# Patient Record
Sex: Female | Born: 1990 | Hispanic: Yes | Smoking: Never smoker
Health system: Southern US, Community
[De-identification: ages and names within clinical notes are randomized; demographics above are authoritative.]

## PROBLEM LIST (undated history)

## (undated) DIAGNOSIS — Z789 Other specified health status: Secondary | ICD-10-CM

## (undated) DIAGNOSIS — D649 Anemia, unspecified: Secondary | ICD-10-CM

## (undated) HISTORY — DX: Anemia, unspecified: D64.9

## (undated) HISTORY — PX: NO PAST SURGERIES: SHX2092

---

## 2018-12-15 ENCOUNTER — Encounter (HOSPITAL_COMMUNITY): Payer: Self-pay | Admitting: Family Medicine

## 2018-12-15 ENCOUNTER — Inpatient Hospital Stay (HOSPITAL_COMMUNITY): Payer: Self-pay

## 2018-12-15 ENCOUNTER — Inpatient Hospital Stay (HOSPITAL_COMMUNITY)
Admission: AD | Admit: 2018-12-15 | Discharge: 2018-12-16 | Disposition: A | Discharge status: Home / Self Care | Payer: Self-pay | Attending: Family Medicine | Admitting: Family Medicine

## 2018-12-15 ENCOUNTER — Other Ambulatory Visit: Payer: Self-pay

## 2018-12-15 DIAGNOSIS — O3680X0 Pregnancy with inconclusive fetal viability, not applicable or unspecified: Secondary | ICD-10-CM

## 2018-12-15 DIAGNOSIS — R109 Unspecified abdominal pain: Secondary | ICD-10-CM | POA: Insufficient documentation

## 2018-12-15 DIAGNOSIS — Z3A01 Less than 8 weeks gestation of pregnancy: Secondary | ICD-10-CM

## 2018-12-15 DIAGNOSIS — R030 Elevated blood-pressure reading, without diagnosis of hypertension: Secondary | ICD-10-CM

## 2018-12-15 DIAGNOSIS — O99891 Other specified diseases and conditions complicating pregnancy: Secondary | ICD-10-CM | POA: Insufficient documentation

## 2018-12-15 DIAGNOSIS — O26891 Other specified pregnancy related conditions, first trimester: Secondary | ICD-10-CM

## 2018-12-15 DIAGNOSIS — O26899 Other specified pregnancy related conditions, unspecified trimester: Secondary | ICD-10-CM

## 2018-12-15 HISTORY — DX: Other specified health status: Z78.9

## 2018-12-15 LAB — POCT PREGNANCY, URINE: Preg Test, Ur: POSITIVE — AB

## 2018-12-15 LAB — HIV ANTIBODY (ROUTINE TESTING W REFLEX): HIV Screen 4th Generation wRfx: NONREACTIVE

## 2018-12-15 LAB — URINALYSIS, ROUTINE W REFLEX MICROSCOPIC
Bacteria, UA: NONE SEEN
Bilirubin Urine: NEGATIVE
Glucose, UA: NEGATIVE mg/dL
Ketones, ur: NEGATIVE mg/dL
Leukocytes,Ua: NEGATIVE
Nitrite: NEGATIVE
Protein, ur: NEGATIVE mg/dL
Specific Gravity, Urine: 1.014 (ref 1.005–1.030)
pH: 7 (ref 5.0–8.0)

## 2018-12-15 LAB — COMPREHENSIVE METABOLIC PANEL
ALT: 20 U/L (ref 0–44)
AST: 23 U/L (ref 15–41)
Albumin: 4.3 g/dL (ref 3.5–5.0)
Alkaline Phosphatase: 64 U/L (ref 38–126)
Anion gap: 10 (ref 5–15)
BUN: 7 mg/dL (ref 6–20)
CO2: 22 mmol/L (ref 22–32)
Calcium: 8.8 mg/dL — ABNORMAL LOW (ref 8.9–10.3)
Chloride: 107 mmol/L (ref 98–111)
Creatinine, Ser: 0.59 mg/dL (ref 0.44–1.00)
GFR calc Af Amer: >60 mL/min (ref >60)
GFR calc non Af Amer: >60 mL/min (ref >60)
Glucose, Bld: 103 mg/dL — ABNORMAL HIGH (ref 70–99)
Potassium: 4 mmol/L (ref 3.5–5.1)
Sodium: 139 mmol/L (ref 135–145)
Total Bilirubin: 0.8 mg/dL (ref 0.3–1.2)
Total Protein: 7.3 g/dL (ref 6.5–8.1)

## 2018-12-15 LAB — CBC WITH DIFFERENTIAL/PLATELET
Abs Immature Granulocytes: 0.04 10*3/uL (ref 0.00–0.07)
Basophils Absolute: 0.1 10*3/uL (ref 0.0–0.1)
Basophils Relative: 1 %
Eosinophils Absolute: 0.2 10*3/uL (ref 0.0–0.5)
Eosinophils Relative: 2 %
HCT: 36.9 % (ref 36.0–46.0)
Hemoglobin: 11.8 g/dL — ABNORMAL LOW (ref 12.0–15.0)
Immature Granulocytes: 0 %
Lymphocytes Relative: 33 %
Lymphs Abs: 3.5 10*3/uL (ref 0.7–4.0)
MCH: 27.5 pg (ref 26.0–34.0)
MCHC: 32 g/dL (ref 30.0–36.0)
MCV: 86 fL (ref 80.0–100.0)
Monocytes Absolute: 0.6 10*3/uL (ref 0.1–1.0)
Monocytes Relative: 6 %
Neutro Abs: 6.2 10*3/uL (ref 1.7–7.7)
Neutrophils Relative %: 58 %
Platelets: 478 10*3/uL — ABNORMAL HIGH (ref 150–400)
RBC: 4.29 MIL/uL (ref 3.87–5.11)
RDW: 13.2 % (ref 11.5–15.5)
WBC: 10.7 10*3/uL — ABNORMAL HIGH (ref 4.0–10.5)
nRBC: 0 % (ref 0.0–0.2)

## 2018-12-15 LAB — ABO/RH: ABO/RH(D): O POS

## 2018-12-15 LAB — HCG, QUANTITATIVE, PREGNANCY: hCG, Beta Chain, Quant, S: 227 m[IU]/mL — ABNORMAL HIGH (ref <5)

## 2018-12-15 NOTE — MAU Note (Signed)
Patient reports to MAU c/o lower abdominal pain that started Sunday night patient reports the pain is a 8/10 that comes and goes. No bleeding. Pt reports slight white discharge. LMP was November 5th 2020.

## 2018-12-15 NOTE — MAU Provider Note (Signed)
History     CSN: [id number]  Arrival date and time: 12/15/18 1933   First Provider Initiated Contact with Patient 12/15/18 2037      Chief Complaint  Patient presents with  . Abdominal Pain   HPI   Carolyn Gomez is a 28 y.o. female G2P1001 @ [redacted]w[redacted]d here in MAU with complaints of abdominal pain. The pain is located in her lower abdomen. The pain started Monday; about 1 week ago. The pain comes and goes. She currently rates her pain 4-5/10. She had a positive pregnancy test on Wednesday.  She has not taken anything for the pain.   Spanish interpretor used.   OB History    Gravida  2   Para  1   Term  1   Preterm      AB      Living  1     SAB      TAB      Ectopic      Multiple      Live Births  1           Past Medical History:  Diagnosis Date  . Medical history non-contributory     Past Surgical History:  Procedure Laterality Date  . NO PAST SURGERIES      History reviewed. No pertinent family history.  Social History   Tobacco Use  . Smoking status: Never Smoker  . Smokeless tobacco: Never Used  Substance Use Topics  . Alcohol use: Never  . Drug use: Never    Allergies: No Known Allergies  No medications prior to admission.    Results for orders placed or performed during the hospital encounter of 12/15/18 (from the past 48 hour(s))  Urinalysis, Routine w reflex microscopic     Status: Abnormal   Collection Time: 12/15/18  7:45 PM  Result Value Ref Range   Color, Urine STRAW (A) YELLOW   APPearance CLEAR CLEAR   Specific Gravity, Urine 1.014 1.005 - 1.030   pH 7.0 5.0 - 8.0   Glucose, UA NEGATIVE NEGATIVE mg/dL   Hgb urine dipstick SMALL (A) NEGATIVE   Bilirubin Urine NEGATIVE NEGATIVE   Ketones, ur NEGATIVE NEGATIVE mg/dL   Protein, ur NEGATIVE NEGATIVE mg/dL   Nitrite NEGATIVE NEGATIVE   Leukocytes,Ua NEGATIVE NEGATIVE   RBC / HPF 0-5 0 - 5 RBC/hpf   WBC, UA 0-5 0 - 5 WBC/hpf   Bacteria, UA NONE SEEN NONE SEEN    Squamous Epithelial / LPF 0-5 0 - 5   Mucus PRESENT     Comment: Performed at [facility], [place] [address], [place], [place] [address]  Pregnancy, urine POC     Status: Abnormal   Collection Time: 12/15/18  7:50 PM  Result Value Ref Range   Preg Test, Ur POSITIVE (A) NEGATIVE    Comment:        THE SENSITIVITY OF THIS METHODOLOGY IS >24 mIU/mL   CBC with Differential/Platelet     Status: Abnormal   Collection Time: 12/15/18  8:57 PM  Result Value Ref Range   WBC 10.7 (H) 4.0 - 10.5 K/uL   RBC 4.29 3.87 - 5.11 MIL/uL   Hemoglobin 11.8 (L) 12.0 - 15.0 g/dL   HCT 36.9 36.0 - 46.0 %   MCV 86.0 80.0 - 100.0 fL   MCH 27.5 26.0 - 34.0 pg   MCHC 32.0 30.0 - 36.0 g/dL   RDW 13.2 11.5 - 15.5 %   Platelets 478 (H) 150 -  400 K/uL   nRBC 0.0 0.0 - 0.2 %   Neutrophils Relative % 58 %   Neutro Abs 6.2 1.7 - 7.7 K/uL   Lymphocytes Relative 33 %   Lymphs Abs 3.5 0.7 - 4.0 K/uL   Monocytes Relative 6 %   Monocytes Absolute 0.6 0.1 - 1.0 K/uL   Eosinophils Relative 2 %   Eosinophils Absolute 0.2 0.0 - 0.5 K/uL   Basophils Relative 1 %   Basophils Absolute 0.1 0.0 - 0.1 K/uL   Immature Granulocytes 0 %   Abs Immature Granulocytes 0.04 0.00 - 0.07 K/uL    Comment: Performed at [facility], [address] [place], [place] [address]  Comprehensive metabolic panel     Status: Abnormal   Collection Time: 12/15/18  8:57 PM  Result Value Ref Range   Sodium 139 135 - 145 mmol/L   Potassium 4.0 3.5 - 5.1 mmol/L   Chloride 107 98 - 111 mmol/L   CO2 22 22 - 32 mmol/L   Glucose, Bld 103 (H) 70 - 99 mg/dL   BUN 7 6 - 20 mg/dL   Creatinine, Ser 1.910.59 0.44 - 1.00 mg/dL   Calcium 8.8 (L) 8.9 - 10.3 mg/dL   Total Protein 7.3 6.5 - 8.1 g/dL   Albumin 4.3 3.5 - 5.0 g/dL   AST 23 15 - 41 U/L   ALT 20 0 - 44 U/L   Alkaline Phosphatase 64 38 - 126 U/L   Total Bilirubin 0.8 0.3 - 1.2 mg/dL   GFR calc non Af Amer >60 >60 mL/min   GFR calc Af Amer >60 >60 mL/min   Anion gap 10 5 - 15     Comment: Performed at [facility], [address] [place], [place] [address]  ABO/Rh     Status: None   Collection Time: 12/15/18  8:57 PM  Result Value Ref Range   ABO/RH(D) O POS    No rh immune globuloin      NOT A RH IMMUNE GLOBULIN CANDIDATE, PT RH POSITIVE Performed at [facility], [address] [place], [place] [address]   hCG, quantitative, pregnancy     Status: Abnormal   Collection Time: 12/15/18  8:57 PM  Result Value Ref Range   hCG, Beta Chain, Quant, S 227 (H) <5 mIU/mL    Comment:          GEST. AGE      CONC.  (mIU/mL)   <=1 WEEK        5 - 50     2 WEEKS       50 - 500     3 WEEKS       100 - 10,000     4 WEEKS     1,000 - 30,000     5 WEEKS     3,500 - 115,000   6-8 WEEKS     12,000 - 270,000    12 WEEKS     15,000 - 220,000        FEMALE AND NON-PREGNANT FEMALE:     LESS THAN 5 mIU/mL Performed at [facility], [address] [place], [place] [address]   HIV Antibody (routine testing w rflx)     Status: None   Collection Time: 12/15/18  8:57 PM  Result Value Ref Range   HIV Screen 4th Generation wRfx NON REACTIVE NON REACTIVE    Comment: Performed at [facility], [address], [place], [place] [address]   US OB LESS THAN 14 WEEKS WITH OB TRANSVAGINAL  Result Date: 12/15/2018 CLINICAL DATA:  Positive pregnancy test and pelvic pain EXAM: OBSTETRIC <14 WK [place] AND TRANSVAGINAL OB US TECHNIQUE: Both transabdominal and transvaginal ultrasound examinations were performed for complete evaluation of the gestation as well as the maternal uterus, adnexal regions, and pelvic cul-de-sac. Transvaginal technique was performed to assess early pregnancy. COMPARISON:  None. FINDINGS: Intrauterine gestational sac: Absent Maternal uterus/adnexae: Minimal free fluid is noted. The ovaries are within normal limits. IMPRESSION: No intrauterine gestational sac is noted. It is uncertain if this represents an early pregnancy or changes of interval  miscarriage. Correlation with quantitative beta HCG levels is recommended. Ultrasound can be repeated as clinically indicated. Electronically Signed   By: Alcide Clever M.D.   On: 12/15/2018 22:52   Review of Systems  Constitutional: Negative for fever.  Gastrointestinal: Positive for abdominal pain.  Genitourinary: Positive for dysuria and urgency. Negative for vaginal bleeding and vaginal discharge.   Physical Exam   Blood pressure (!) 144/80, pulse 74, temperature (!) 79 F (26.1 C), resp. rate 18, last menstrual period 11/08/2018.   Patient Vitals for the past 24 hrs:  BP Temp Temp src Pulse Resp  12/16/18 0053 (!) 144/80 -- -- -- 18  12/16/18 0038 (!) 144/80 -- -- 74 --  12/15/18 2154 123/85 -- -- 79 --  12/15/18 2005 (!) 158/92 (!) 79 F (26.1 C) -- -- --  12/15/18 2002 -- 98.4 F (36.9 C) Oral 78 18    Physical Exam  Constitutional: She is oriented to person, place, and time. She appears well-developed and well-nourished. No distress.  HENT:  Head: Normocephalic.  GI: Soft. She exhibits no distension and no mass. There is no abdominal tenderness. There is no rebound and no guarding.  Genitourinary:    Genitourinary Comments: Bimanual exam: Cervix closed, No CMT Uterus non tender, enlarged  Adnexa non tender, no masses bilaterally GC/Chlam, wet prep done without speculum.  Chaperone present for exam.    Musculoskeletal:        General: Normal range of motion.  Neurological: She is alert and oriented to person, place, and time.  Skin: Skin is warm. She is not diaphoretic.  Psychiatric: Her behavior is normal.   MAU Course  Procedures  None  MDM  Wet prep & GC HIV, CBC, Hcg, ABO US OB transvaginal  O positive blood type [place] discussed in detail with patient and partner using spanish interpretor.   Assessment and Plan   A:  1. Pregnancy of unknown anatomic location   2. Abdominal pain in pregnancy   3. [redacted] weeks gestation of pregnancy   4. Elevated BP without  diagnosis of hypertension     P:  Discharge home with strict return precautions Ectopic precautions Go to the [facility] office on Tuesday 12/15 @ 0900 for stat blood work Return to MAU if symptoms worsen OK to use tylenol OTC as directed on the bottle Pelvic rest  Tamrah Victorino, Harolyn Rutherford, NP 12/16/2018 10:11 AM

## 2018-12-16 LAB — WET PREP, GENITAL
Sperm: NONE SEEN
Trich, Wet Prep: NONE SEEN
Yeast Wet Prep HPF POC: NONE SEEN

## 2018-12-16 MED ORDER — ACETAMINOPHEN 500 MG PO TABS: 1000.0000 mg | ORAL_TABLET | Freq: Once | ORAL | Status: DC

## 2018-12-16 NOTE — Discharge Instructions (Signed)
Dolor abdominal durante el embarazo Abdominal Pain During Pregnancy  El dolor de vientre (abdominal) es habitual durante el embarazo. Hay muchas causas posibles. Generalmente, no se trata de un problema grave. Otras veces puede ser un signo de que algo no anda bien. Siempre comunquese con su mdico si tiene dolor abdominal. Siga estas indicaciones en su casa:  No tenga relaciones sexuales ni se coloque nada dentro de la vagina hasta que el dolor haya desaparecido completamente.  Descanse todo lo que pueda [employer] dolor se le haya calmado.  Beba suficiente lquido como para mantener el pis (orina) de color amarillo plido.  Tome los medicamentos de venta libre y los recetados solamente como se lo haya indicado el mdico.  Consulting civil engineer a todas las visitas de control como se lo haya indicado el mdico. Esto es importante. Comunquese con un mdico si:  El dolor contina o empeora despus de Production assistant, radio.  Siente dolor en la parte inferior del abdomen que: ? Va y viene en intervalos regulares. ? Se extiende a la espalda. ? Se siente como clicos menstruales.  Siente dolor o ardor al Garment/textile technologist. Solicite ayuda de inmediato si:  Tiene fiebre o siente escalofros.  Tiene una hemorragia vaginal abundante.  Tiene una prdida de lquido por la vagina.  Elimina tejidos por la vagina.  Vomita durante ms de 24horas.  Tiene heces lquidas (diarrea) durante ms de 24horas.  El beb se mueve menos de lo habitual.  Se siente dbil o se desmaya.  Le falta el aire.  Tiene dolor muy intenso en la parte superior del abdomen. Resumen  El dolor de vientre (abdominal) es habitual durante el embarazo. Hay muchas causas posibles.  Si tiene Media planner abdomen durante el embarazo, avise al mdico de inmediato.  Concurra a todas las visitas de control como se lo haya indicado el mdico. Esto es importante. Esta informacin no tiene Marine scientist el consejo del mdico. Asegrese de  hacerle al mdico cualquier pregunta que tenga. Document Released: 09/01/2010 Document Revised: 06/14/2016 Document Reviewed: 06/14/2016 Elsevier Patient Education  2020 [employer].

## 2018-12-17 LAB — GC/CHLAMYDIA PROBE AMP (~~LOC~~) NOT AT ARMC
Chlamydia: NEGATIVE
Comment: NEGATIVE
Comment: NORMAL
Neisseria Gonorrhea: NEGATIVE

## 2018-12-18 ENCOUNTER — Ambulatory Visit: Payer: Self-pay

## 2018-12-18 ENCOUNTER — Other Ambulatory Visit: Payer: Self-pay

## 2018-12-18 ENCOUNTER — Ambulatory Visit (INDEPENDENT_AMBULATORY_CARE_PROVIDER_SITE_OTHER): Payer: Self-pay | Admitting: *Deleted

## 2018-12-18 DIAGNOSIS — O3680X0 Pregnancy with inconclusive fetal viability, not applicable or unspecified: Secondary | ICD-10-CM

## 2018-12-18 LAB — BETA HCG QUANT (REF LAB): hCG Quant: 646 m[IU]/mL

## 2018-12-18 NOTE — Progress Notes (Signed)
Interpreter Carolyn Gomez present for encounter.  Pt reports having mild abdominal cramping and denies presence of vaginal bleeding. She was advised that she will be called later today with lab test result and plan of care. Pt voiced understanding.   1700  Pt was called and informed of appropriate rise in pregnancy hormone. She will need followup US to check for viability. This has been scheduled on 12/28 @ 3 pm.  Pt voiced understanding and agrees to plan of care.

## 2018-12-19 NOTE — Progress Notes (Signed)
Patient seen and assessed by nursing staff.  Agree with documentation and plan.  

## 2018-12-31 ENCOUNTER — Ambulatory Visit (HOSPITAL_COMMUNITY)
Admission: RE | Admit: 2018-12-31 | Discharge: 2018-12-31 | Disposition: A | Discharge status: Home / Self Care | Payer: Self-pay | Source: Ambulatory Visit | Attending: Family Medicine | Admitting: Family Medicine

## 2018-12-31 ENCOUNTER — Encounter: Payer: Self-pay | Admitting: Family Medicine

## 2018-12-31 ENCOUNTER — Ambulatory Visit (INDEPENDENT_AMBULATORY_CARE_PROVIDER_SITE_OTHER): Payer: Self-pay

## 2018-12-31 ENCOUNTER — Other Ambulatory Visit: Payer: Self-pay

## 2018-12-31 DIAGNOSIS — Z712 Person consulting for explanation of examination or test findings: Secondary | ICD-10-CM

## 2018-12-31 DIAGNOSIS — O3680X0 Pregnancy with inconclusive fetal viability, not applicable or unspecified: Secondary | ICD-10-CM | POA: Insufficient documentation

## 2018-12-31 NOTE — Progress Notes (Signed)
Pt here today following [place] for viability. Results reviewed with Kennon Rounds, MD who finds single living IUP and recommends pt begin prenatal care. Results and dating reviewed with pt; pt is 6w 2d today with a EDD of 08/24/19. Medications and allergies reviewed with pt; list of medications safe to take during pregnancy given. [place] office notified to provide proof of pregnancy letter and pt encouraged to begin prenatal care.   Interpreter Zoila with [employer] translated this encounter.   Apolonio Schneiders RN 12/31/18

## 2018-12-31 NOTE — Progress Notes (Signed)
Patient seen and assessed by nursing staff.  Agree with documentation and plan.  

## 2019-01-04 NOTE — L&D Delivery Note (Addendum)
OB/GYN Faculty Practice Delivery Note  Carolyn Gomez is a 29 y.o. G2P1001 s/p VD at [redacted]w[redacted]d. She was admitted for PPROM.   ROM: 11h 47m with clear/white fluid GBS Status: unknown, received PNC 2/2 preterm Maximum Maternal Temperature: 98.6  Labor Progress: This is a 29 yo G2P1001 at [redacted]w[redacted]d who presented as PPROM. During her course she received BMZ x1 and PCN for GBS unknown and preterm. Patient had epidural for pain management. She progressed to completed and after short few pushes delivered a viable female infant. She did receive TXA and methergine prophylactic due to known history of PPH with prior delivery.   Delivery Date/Time: 07/27/2019 @1530  Delivery: Called to room and patient was complete and pushing. Head delivered vertex ROA. Nuchal cord present x1 and reduced. Shoulder and body delivered in usual fashion. Infant with spontaneous cry, placed on mother's abdomen, dried and stimulated. Cord clamped after 1-minute delay, and cut by father under my direct supervision. Cord blood drawn. Placenta delivered spontaneously with gentle cord traction. Fundus firm with massage and Pitocin. Labia, perineum, vagina, and cervix were inspected, and presented with left labial laceration that was hemostatic without repair.   Placenta: spontaneous, intact, 3v cord delivered @ 1539 Complications: none  Lacerations: left, labial, hemostatic, no repair EBL: 75 cc Analgesia: Epidural  Postpartum Planning [x]  message to sent to schedule follow-up  [x]  vaccines UTD  Infant: Female  APGARs 9/9  pending weight check  , DO Family Medicine, PGY 3  GME ATTESTATION:  I saw and evaluated the patient. I agree with the findings and the plan of care as documented in the resident's note.  , MD OB Fellow, Faculty [facility], [facility] 07/27/2019 4:36 PM

## 2019-02-26 ENCOUNTER — Telehealth: Payer: Self-pay | Admitting: *Deleted

## 2019-02-26 ENCOUNTER — Other Ambulatory Visit: Payer: Self-pay | Admitting: Family Medicine

## 2019-02-26 DIAGNOSIS — Z3490 Encounter for supervision of normal pregnancy, unspecified, unspecified trimester: Secondary | ICD-10-CM

## 2019-02-26 NOTE — Telephone Encounter (Signed)
LVM using [facility] interpreter Domingo Cocking [phone].  Asking pt to call office to go over screening questions prior to visit.Natajah Derderian Zimmerman Rumple, CMA

## 2019-02-27 ENCOUNTER — Other Ambulatory Visit: Payer: Self-pay

## 2019-02-27 DIAGNOSIS — Z3490 Encounter for supervision of normal pregnancy, unspecified, unspecified trimester: Secondary | ICD-10-CM

## 2019-02-28 LAB — OBSTETRIC PANEL, INCLUDING HIV
Antibody Screen: NEGATIVE
Basophils Absolute: 0 10*3/uL (ref 0.0–0.2)
Basos: 1 %
EOS (ABSOLUTE): 0.2 10*3/uL (ref 0.0–0.4)
Eos: 2 %
HIV Screen 4th Generation wRfx: NONREACTIVE
Hematocrit: 33.9 % — ABNORMAL LOW (ref 34.0–46.6)
Hemoglobin: 11.3 g/dL (ref 11.1–15.9)
Hepatitis B Surface Ag: NEGATIVE
Immature Grans (Abs): 0.1 10*3/uL (ref 0.0–0.1)
Immature Granulocytes: 1 %
Lymphocytes Absolute: 1.9 10*3/uL (ref 0.7–3.1)
Lymphs: 22 %
MCH: 28.3 pg (ref 26.6–33.0)
MCHC: 33.3 g/dL (ref 31.5–35.7)
MCV: 85 fL (ref 79–97)
Monocytes Absolute: 0.5 10*3/uL (ref 0.1–0.9)
Monocytes: 6 %
Neutrophils Absolute: 5.9 10*3/uL (ref 1.4–7.0)
Neutrophils: 68 %
Platelets: 445 10*3/uL (ref 150–450)
RBC: 3.99 x10E6/uL (ref 3.77–5.28)
RDW: 13.7 % (ref 11.7–15.4)
RPR Ser Ql: NONREACTIVE
Rh Factor: POSITIVE
Rubella Antibodies, IGG: 12.2 index (ref >0.99)
WBC: 8.6 10*3/uL (ref 3.4–10.8)

## 2019-03-01 LAB — HGB FRAC. W/SOLUBILITY
Hgb A2 Quant: 2.3 % (ref 1.8–3.2)
Hgb A: 97.2 % (ref 96.4–98.8)
Hgb C: 0 %
Hgb F Quant: 0.5 % (ref 0.0–2.0)
Hgb S: 0 %
Hgb Solubility: NEGATIVE
Hgb Variant: 0 %

## 2019-03-02 LAB — CULTURE, OB URINE

## 2019-03-02 LAB — URINE CULTURE, OB REFLEX

## 2019-03-03 ENCOUNTER — Telehealth: Payer: Self-pay | Admitting: Family Medicine

## 2019-03-03 MED ORDER — CEPHALEXIN 500 MG PO CAPS: 500.0000 mg | ORAL_CAPSULE | Freq: Two times a day (BID) | ORAL | 0 refills | Status: AC

## 2019-03-03 NOTE — Telephone Encounter (Signed)
+   Lactobacillus.  Result discussed.  Treatment recommended.  F/U with Dr. Talbert Forest as planned.  She and her husband verbalized understanding.  TOC in 3 weeks.

## 2019-03-06 ENCOUNTER — Other Ambulatory Visit (HOSPITAL_COMMUNITY)
Admission: RE | Admit: 2019-03-06 | Discharge: 2019-03-06 | Disposition: A | Discharge status: Home / Self Care | Payer: Self-pay | Source: Ambulatory Visit | Attending: Family Medicine | Admitting: Family Medicine

## 2019-03-06 ENCOUNTER — Other Ambulatory Visit: Payer: Self-pay

## 2019-03-06 ENCOUNTER — Encounter: Payer: Self-pay | Admitting: Family Medicine

## 2019-03-06 ENCOUNTER — Ambulatory Visit (INDEPENDENT_AMBULATORY_CARE_PROVIDER_SITE_OTHER): Payer: Self-pay | Admitting: Family Medicine

## 2019-03-06 VITALS — BP 104/64 | HR 78 | Ht 61.0 in | Wt 183.2 lb

## 2019-03-06 DIAGNOSIS — Z3492 Encounter for supervision of normal pregnancy, unspecified, second trimester: Secondary | ICD-10-CM

## 2019-03-06 MED ORDER — PRENATAL VITAMIN 27-0.8 MG PO TABS: 1.0000 | ORAL_TABLET | Freq: Every day | ORAL | 3 refills | Status: DC

## 2019-03-06 NOTE — Patient Instructions (Addendum)
Thank you for coming to see me today. It was a pleasure! Today we talked about:   If you have any cramping/contractions, vaginal bleeding, or fluid leaking, go immediately to [facility] to be evaluated.  Please call the health department to have your flu shot schedule.  Please follow-up with our OB clinic in 4 weeks or sooner as needed.  If you have any questions or concerns, please do not hesitate to call the office at [phone].  Take Care,   Carolyn Kamryn Gauthier, DO  [place] trimestre de [place] Second Trimester of Pregnancy  El segundo trimestre va desde la semana14 hasta la 54 (desde el mes 4 hasta el 6). Este suele ser el momento en el que mejor se siente. En general, las nuseas matutinas han disminuido o han desaparecido completamente. Tendr ms energa y podr aumentarle el apetito. El beb en gestacin se desarrolla rpidamente. Hacia el final del sexto mes, el beb mide aproximadamente 9 pulgadas (23 cm) y pesa alrededor de 1 libras (700 g). Es probable que sienta al beb [employer] 53 y 83 semanas del [place]. Siga estas indicaciones en su casa: Medicamentos  [employer] de venta libre y los recetados solamente como se lo haya indicado el mdico. Algunos medicamentos son seguros para tomar durante el Media planner y otros no lo son.  Tome vitaminas prenatales que contengan por lo menos [id number] (?g) de cido flico.  Si tiene dificultad para mover el intestino (estreimiento), tome un medicamento para ablandar las heces (laxante) si su mdico se lo autoriza. Comida y bebida   Ingiera alimentos saludables de [place] regular.  No coma carne cruda ni quesos sin cocinar.  Si obtiene poca cantidad de calcio de los alimentos que ingiere, consulte a su mdico sobre la posibilidad de tomar un suplemento diario de calcio.  Evite el consumo de alimentos ricos en grasas y azcares, como los alimentos fritos y los dulces.  Si tiene Higher education careers adviser  (nuseas) o devuelve (vomita): ? Ingiera 4 o 5comidas pequeas por TEFL teacher de 3abundantes. ? Intente comer algunas galletitas saladas. ? Beba lquidos [employer], en lugar de [employer].  Para evitar el estreimiento: ? Consuma alimentos ricos en fibra, como frutas y verduras frescas, cereales integrales y frijoles. ? Beba suficiente lquido para mantener el pis (orina) claro o de color amarillo plido. Actividad  Haga ejercicios solamente como se lo haya indicado el mdico. Interrumpa la actividad fsica si comienza a tener calambres.  No haga ejercicio si hace demasiado calor, hay demasiada humedad o se encuentra en un lugar de mucha altura (altitud alta).  Evite levantar pesos [employer].  Use zapatos con tacones bajos. Mantenga una buena postura al sentarse y pararse.  Puede continuar teniendo [employer], a menos que el mdico le indique lo contrario. Alivio del dolor y del Tree surgeon  Use un sostn que le brinde buen soporte si sus mamas estn sensibles.  Dese baos de asiento con agua tibia para Best boy o las molestias causadas por las hemorroides. Use una crema para las hemorroides si el mdico la autoriza.  Descanse con las piernas elevadas si tiene calambres o dolor de cintura.  Si desarrolla venas hinchadas y abultadas (vrices) en las piernas: ? Use medias de compresin o medias de descanso como se lo haya indicado el mdico. ? Levante (eleve) los pies durante 57minutos, 3 o 4veces por Training and development officer. ? Limite el consumo de sal en sus alimentos. Cuidado prenatal  [place] sus preguntas. Llvelas  cuando concurra a las visitas prenatales.  Concurra a todas las visitas prenatales como se lo haya indicado el mdico. Esto es importante. Seguridad  [employer] cinturn de seguridad cuando conduzca.  Haga una lista de los nmeros de telfono de Associate Professor, que [employer] nmeros de telfono de familiares, amigos, [facility], as como los  departamentos de polica y bomberos. Instrucciones generales  Consulte a su mdico sobre los [employer] debe comer o pdale que la ayude a Clinical research associate a quien pueda aconsejarla si necesita ese servicio.  Consulte a su mdico acerca de dnde se dictan clases prenatales cerca de donde vive. Comience las clases antes del mes 6 de embarazo.  No se d baos de inmersin en agua caliente, baos turcos ni saunas.  No se haga duchas vaginales ni use tampones o toallas higinicas perfumadas.  No mantenga las piernas cruzadas durante [place].  Vaya al dentista si an no lo hizo. Use un cepillo de cerdas suaves para cepillarse los dientes. Psese el hilo dental suavemente.  No fume, no consuma hierbas ni beba alcohol. No tome frmacos que el mdico no haya autorizado.  No consuma ningn producto que contenga nicotina o tabaco, como cigarrillos y Administrator, Civil Service. Si necesita ayuda para dejar de fumar, consulte al mdico.  Evite el contacto con las bandejas sanitarias de los gatos y la tierra que estos animales usan. Estos elementos contienen bacterias que pueden causar defectos congnitos al beb y la posible prdida del beb (aborto espontneo) o la muerte fetal. Comunquese con un mdico si:  Tiene clicos leves o siente presin en la parte baja del vientre.  Tiene dolor al hacer pis (orinar).  Advierte un lquido con olor ftido que proviene de la vagina.  Tiene Programme researcher, broadcasting/film/video (nuseas), devuelve (vomita) o tiene deposiciones acuosas (diarrea).  Sufre un dolor persistente en el abdomen.  Siente mareos. Solicite ayuda de inmediato si:  Tiene fiebre.  Tiene una prdida de lquido por la vagina.  Tiene sangrado o pequeas prdidas vaginales.  Siente dolor intenso o clicos en el abdomen.  Sube o baja de peso rpidamente.  Tiene dificultades para recuperar el aliento y siente dolor en el pecho.  Sbitamente se le hinchan mucho el rostro, las [place], los tobillos,  los pies o las piernas.  No ha sentido los movimientos del beb durante Georgianne Fick.  Siente un dolor de cabeza intenso que no se alivia al tomar [employer].  Tiene dificultad para ver. Resumen  El segundo trimestre va desde la semana14 hasta la 27, desde el mes 4 hasta el 6. Este suele ser el momento en el que mejor se siente.  Para cuidarse y cuidar a su beb en gestacin, debe comer alimentos saludables, tomar medicamentos solamente si su mdico le indica que lo haga y hacer actividades que sean seguras para usted y su beb.  Llame al mdico si se enferma o si nota algo inusual acerca de su embarazo. Tambin llame al mdico si necesita ayuda para saber qu alimentos debe comer o si quiere saber qu actividades puede realizar de forma segura. Esta informacin no tiene Theme park manager el consejo del mdico. Asegrese de hacerle al mdico cualquier pregunta que tenga. Document Revised: 09/14/2016 Document Reviewed: 09/14/2016 Elsevier Patient Education  2020 [employer].

## 2019-03-06 NOTE — Progress Notes (Addendum)
Carolyn Gomez is a 29 y.o. yo G2P1001  at [redacted]w[redacted]d who presents for her initial prenatal visit. Pregnancy is not planned She reports Previously with nausea and vomiting during the first few months but now currently asymptomatic.  Does report some spotting last week that has completely resolved.. She  is not taking PNV. See flow sheet for details.  PMH, POBH, FH, meds, allergies and Social Hx reviewed.  Prenatal Exam: Gen: Well nourished, well developed.  No distress.  Vitals noted. HEENT: Normocephalic, atraumatic.  Neck supple without cervical lymphadenopathy, thyromegaly or thyroid nodules.  Fair dentition. CV: RRR no murmur, gallops or rubs Lungs: CTAB.  Normal respiratory effort without wheezes or rales. Abd: soft, NTND. +BS.   GU: Normal external female genitalia without lesions.  Normal vaginal, well rugated without lesions. No vaginal discharge.  Bimanual exam: No adnexal mass or TTP. No CMT.  Uterus size wnl. Ext: No clubbing, cyanosis or edema. Psych: Normal grooming and dress.  Not depressed or anxious appearing.  Normal thought content and process without flight of ideas or looseness of associations.  Assessment & Plan: 1) 29 y.o. yo G2P1001 at [redacted]w[redacted]d via early ultrasound on 12/28 (patient on OCPs when she conceived making her LMP not as reliable) doing well.  Patient reports last Pap smear 3 years ago, repeat a Pap smear with GC/chlamydia done today. Current pregnancy issues include: none. Dating is reliable. Prenatal labs reviewed, notable for lactobacillus growing in urine, patient currently with 2 days left of her Keflex.  She is to follow-up next week for quad screen as well as repeat OB culture. Genetic screening offered: Patient to have done 03/11/2019. Early glucola is not indicated.  PHQ-9 and Pregnancy Medical Home forms completed and reviewed.  Bleeding and pain precautions reviewed. Importance of prenatal vitamins reviewed.  Follow up in 4 weeks in OB  clinic. Patient to have flu shot at health department.  [place] Fe Okubo, DO PGY-3, [facility]

## 2019-03-07 LAB — CYTOLOGY - PAP
Adequacy: ABSENT
Chlamydia: NEGATIVE
Comment: NEGATIVE
Comment: NORMAL
Diagnosis: NEGATIVE
Neisseria Gonorrhea: NEGATIVE

## 2019-03-11 ENCOUNTER — Encounter: Payer: Self-pay | Admitting: Family Medicine

## 2019-03-11 ENCOUNTER — Other Ambulatory Visit: Payer: Self-pay

## 2019-03-15 ENCOUNTER — Encounter: Payer: Self-pay | Admitting: Family Medicine

## 2019-04-03 ENCOUNTER — Ambulatory Visit (INDEPENDENT_AMBULATORY_CARE_PROVIDER_SITE_OTHER): Payer: Self-pay | Admitting: Family Medicine

## 2019-04-03 ENCOUNTER — Other Ambulatory Visit: Payer: Self-pay

## 2019-04-03 VITALS — BP 102/60 | HR 89 | Wt 189.2 lb

## 2019-04-03 DIAGNOSIS — Z3492 Encounter for supervision of normal pregnancy, unspecified, second trimester: Secondary | ICD-10-CM

## 2019-04-03 DIAGNOSIS — Z3402 Encounter for supervision of normal first pregnancy, second trimester: Secondary | ICD-10-CM

## 2019-04-03 LAB — POCT 1 HR PRENATAL GLUCOSE: Glucose 1 Hr Prenatal, POC: 84 mg/dL

## 2019-04-03 NOTE — Progress Notes (Signed)
  Patient Name: Delton Prairie Date of Birth: March 17, 1990 [facility] Prenatal Visit  [place] Matthias Hughs is a 29 y.o. G2P1001 at [redacted]w[redacted]d here for routine follow up. She is dated by early ultrasound (6 weeks).  She reports no complaints.  She denies vaginal bleeding.  See flow sheet for details.  Vitals:   04/03/19 0845  BP: 102/60  Pulse: 89  Fetal heart tones 130 bpm.   A/P: Pregnancy at [redacted]w[redacted]d.  Doing well.   . Dating reviewed, dating tab is correct . Fetal heart tones Appropriate . Pregnancy issues include None  . Problem list updated: Yes.  . Influenza vaccine not administered as not influenza season.   . The patient has the following indication for screening preexisting diabetes: BMI > 25 and high risk ethnicity (Latino, [place] American, Native American, [place] Islander, Asian American) , 1 hour GTT was performed today. . Anatomy ultrasound ordered to be scheduled at 18-20 weeks, schedule through [employer] program. . Patient is interested in genetic screening. As she is past 13 weeks and 6 days, a Quad screen  was offered (previously offered at initial OB, however never followed up to have this completed, will collect today.  . Pregnancy education including expected weight gain in pregnancy, OTC medication use, continued use of prenatal vitamin, smoking cessation if applicable, and nutrition in pregnancy.   . Bleeding and pain precautions reviewed.  Asymptomatic bacteriuria: > 100,000 colonies of lactobacillus, treated with Keflex in early March.  Will repeat OB culture today to ensure resolution.   Follow up 4 weeks, or sooner if needed.  Provided with MAU/women and children's address.  Allayne Stack, DO

## 2019-04-03 NOTE — Patient Instructions (Signed)
Wonderful to see you, we have scheduled your anatomy scan.  We will also recheck your urine today and get your genetic screening.  In addition, we should go ahead and check your sugar, you can come back later this week or early next to have this checked.

## 2019-04-04 ENCOUNTER — Encounter: Payer: Self-pay | Admitting: Family Medicine

## 2019-04-04 DIAGNOSIS — Z3493 Encounter for supervision of normal pregnancy, unspecified, third trimester: Secondary | ICD-10-CM | POA: Insufficient documentation

## 2019-04-04 DIAGNOSIS — Z3402 Encounter for supervision of normal first pregnancy, second trimester: Secondary | ICD-10-CM | POA: Insufficient documentation

## 2019-04-05 LAB — URINE CULTURE, OB REFLEX

## 2019-04-05 LAB — CULTURE, OB URINE

## 2019-04-06 LAB — AFP TETRA
DIA Mom Value: 0.62
DIA Value (EIA): 94.46 pg/mL
DSR (By Age)    1 IN: 682
DSR (Second Trimester) 1 IN: 10000
Gestational Age: 19.6 WEEKS
MSAFP Mom: 0.97
MSAFP: 46.2 ng/mL
MSHCG Mom: 0.7
MSHCG: 14677 m[IU]/mL
Maternal Age At EDD: 30.2 yr
Osb Risk: 10000
T18 (By Age): 1:2657 {titer}
Test Results:: NEGATIVE
Weight: 189 [lb_av]
uE3 Mom: 0.93
uE3 Value: 1.79 ng/mL

## 2019-04-09 NOTE — Progress Notes (Signed)
Attempted to call pt no answer or VM set up. Will try again later. Nethaniel Mattie Bruna Potter, CMA

## 2019-04-10 ENCOUNTER — Telehealth: Payer: Self-pay | Admitting: *Deleted

## 2019-04-10 NOTE — Telephone Encounter (Signed)
-----   Message from Allayne Stack, DO sent at 04/09/2019  9:50 AM EDT ----- Please let patient know her genetic screening is negative, meaning no concern seen with this. Additionally her glucose screen was normal, will likely just recheck later in her pregnancy. Thank you! Dr Annia Friendly

## 2019-04-10 NOTE — Telephone Encounter (Signed)
Pt informed using spanish interpreter sedi [phone]. Kessa Fairbairn Bruna Potter, CMA

## 2019-04-22 ENCOUNTER — Encounter: Payer: Self-pay | Admitting: Family Medicine

## 2019-05-06 NOTE — Progress Notes (Signed)
  [facility] Prenatal Visit  Carolyn Gomez is a 29 y.o. G2P1001 at [redacted]w[redacted]d here for routine follow up. She is dated by early ultrasound at 6 weeks.  She reports Generally no complaints, occasionally has heartburn improved with drinking some milk.  She reports good fetal movement. No bleeding, loss of fluid, contractions. See flow sheet for details.  Quad screen negative.  Undecided on birth control options, considering post placental IUD if would not be too expensive for her.  Would like to breast-feed.  Vitals:   05/07/19 1336  BP: (!) 100/58  Pulse: 85  Fetal heart tones: 150 bpm Fundal height: 25 cm   A/P: Pregnancy at [redacted]w[redacted]d.  Doing well.   . Dating reviewed, dating tab is correct . Fetal heart tones Appropriate . Fundal height within expected range.  . Pregnancy issues include None . Marland Kitchen Anatomy ultrasound reviewed and notable for single live intrauterine pregnancy with normal amniotic fluid volume, no gross fetal abnormalities identified.  . Problem list updated Yes.  . Influenza vaccine not administered as not influenza season. .  . Indications for screening for preexisting diabetes include: Early 1 hour screening on 3/31 was normal, high risk due to pregravida BMI and ethnicity, will do additional screen on next visit.  . Pregnancy education provided on the following topics: fetal growth and movement, ultrasound assessment, and upcoming laboratory assessment.   . Scheduled for [facility] during second trimester. . Preterm labor precautions given.   Follow up 4 weeks with [facility] faculty clinic.  Should receive 28-week labs (CBC, RPR, HIV) and 1 hour Glucola at that time, patient informed.  Allayne Stack, DO

## 2019-05-07 ENCOUNTER — Other Ambulatory Visit: Payer: Self-pay

## 2019-05-07 ENCOUNTER — Encounter: Payer: Self-pay | Admitting: Family Medicine

## 2019-05-07 ENCOUNTER — Ambulatory Visit (INDEPENDENT_AMBULATORY_CARE_PROVIDER_SITE_OTHER): Payer: Self-pay | Admitting: Family Medicine

## 2019-05-07 VITALS — BP 100/58 | HR 85 | Wt 193.0 lb

## 2019-05-07 DIAGNOSIS — Z3402 Encounter for supervision of normal first pregnancy, second trimester: Secondary | ICD-10-CM

## 2019-05-07 NOTE — Patient Instructions (Signed)
It was wonderful seeing you today!  If you have any leakage of fluid, vaginal bleeding, severe abdominal pains, headache not improving with regular management, please make sure you get MAU.  Please follow-up in 4 weeks, expect labs and the glucose test at this time.

## 2019-06-13 ENCOUNTER — Ambulatory Visit (INDEPENDENT_AMBULATORY_CARE_PROVIDER_SITE_OTHER): Payer: Self-pay | Admitting: Family Medicine

## 2019-06-13 ENCOUNTER — Other Ambulatory Visit: Payer: Self-pay

## 2019-06-13 VITALS — BP 105/70 | HR 93 | Wt 197.0 lb

## 2019-06-13 DIAGNOSIS — O99210 Obesity complicating pregnancy, unspecified trimester: Secondary | ICD-10-CM | POA: Insufficient documentation

## 2019-06-13 DIAGNOSIS — Z9189 Other specified personal risk factors, not elsewhere classified: Secondary | ICD-10-CM

## 2019-06-13 DIAGNOSIS — Z3493 Encounter for supervision of normal pregnancy, unspecified, third trimester: Secondary | ICD-10-CM

## 2019-06-13 DIAGNOSIS — O99213 Obesity complicating pregnancy, third trimester: Secondary | ICD-10-CM

## 2019-06-13 DIAGNOSIS — Z3402 Encounter for supervision of normal first pregnancy, second trimester: Secondary | ICD-10-CM

## 2019-06-13 LAB — POCT 1 HR PRENATAL GLUCOSE: Glucose 1 Hr Prenatal, POC: 97 mg/dL

## 2019-06-13 MED ORDER — TETANUS-DIPHTH-ACELL PERTUSSIS 5-2.5-18.5 LF-MCG/0.5 IM SUSP: 0.5000 mL | Freq: Once | INTRAMUSCULAR | 0 refills | Status: AC

## 2019-06-13 MED ORDER — TETANUS-DIPHTH-ACELL PERTUSSIS 5-2.5-18.5 LF-MCG/0.5 IM SUSP: 0.5000 mL | Freq: Once | INTRAMUSCULAR | 0 refills | Status: DC

## 2019-06-13 NOTE — Patient Instructions (Signed)
It was a pleasure meeting you today.  You were seen for a prenatal visit  You will have some blood work today.  I would encourage you to consider the COVID vaccine   Follow up in 2 weeks with your PCP  Dana Allan, MD [facility] Medicine Residency  Segundo trimestre de embarazo Second Trimester of Pregnancy  El segundo trimestre va desde la semana14 hasta la 27 (desde el mes 4 hasta el 6). Este suele ser el momento en el que mejor se siente. En general, las nuseas matutinas han disminuido o han desaparecido completamente. Tendr ms energa y podr aumentarle el apetito. El beb en gestacin se desarrolla rpidamente. Hacia el final del sexto mes, el beb mide aproximadamente 9 pulgadas (23 cm) y pesa alrededor de 1 libras (700 g). Es probable que sienta al beb [employer] 18 y 20 semanas del [place]. Siga estas indicaciones en su casa: Medicamentos  [employer] de venta libre y los recetados solamente como se lo haya indicado el mdico. Algunos medicamentos son seguros para tomar durante el Psychiatrist y otros no lo son.  Tome vitaminas prenatales que contengan por lo menos (?g) de cido flico.  Si tiene dificultad para mover el intestino (estreimiento), tome un medicamento para ablandar las heces (laxante) si su mdico se lo autoriza. Comida y bebida   Ingiera alimentos saludables de [place] regular.  No coma carne cruda ni quesos sin cocinar.  Si obtiene poca cantidad de calcio de los alimentos que ingiere, consulte a su mdico sobre la posibilidad de tomar un suplemento diario de calcio.  Evite el consumo de alimentos ricos en grasas y azcares, como los alimentos fritos y los dulces.  Si tiene Programme researcher, broadcasting/film/video (nuseas) o devuelve (vomita): ? Ingiera 4 o 5comidas pequeas por Geophysical data processor de 3abundantes. ? Intente comer algunas galletitas saladas. ? Beba lquidos Altria Group, en lugar de [employer] Scientific.  Para evitar el  estreimiento: ? Consuma alimentos ricos en fibra, como frutas y verduras frescas, cereales integrales y frijoles. ? Beba suficiente lquido para mantener el pis (orina) claro o de color amarillo plido. Actividad  Haga ejercicios solamente como se lo haya indicado el mdico. Interrumpa la actividad fsica si comienza a tener calambres.  No haga ejercicio si hace demasiado calor, hay demasiada humedad o se encuentra en un lugar de mucha altura (altitud alta).  Evite levantar pesos [employer] Brands.  Use zapatos con tacones bajos. Mantenga una buena postura al sentarse y pararse.  Puede continuar teniendo [employer] [place] [employer], a menos que el mdico le indique lo contrario. Alivio del dolor y del Dentist  Use un sostn que le brinde buen soporte si sus mamas estn sensibles.  Dese baos de asiento con agua tibia para Engineer, materials o las molestias causadas por las hemorroides. Use una crema para las hemorroides si el mdico la autoriza.  Descanse con las piernas elevadas si tiene calambres o dolor de cintura.  Si desarrolla venas hinchadas y abultadas (vrices) en las piernas: ? Use medias de compresin o medias de descanso como se lo haya indicado el mdico. ? Levante (eleve) los pies durante , 3 o 4veces por Futures trader. ? Limite el consumo de sal en sus alimentos. Cuidado prenatal  Tonga sus preguntas. Llvelas cuando concurra a las visitas prenatales.  Concurra a todas las visitas prenatales como se lo haya indicado el mdico. Esto es importante. Seguridad  Mellon Financial cinturn de seguridad cuando conduzca.  Haga una lista de los nmeros  de telfono de emergencia, que BJ's nmeros de telfono de familiares, amigos, [employer] Resources, as como los departamentos de [place] y bomberos. Instrucciones generales  Consulte a su mdico sobre los Pepco Holdings debe comer o pdale que la ayude a Pension scheme manager a quien pueda aconsejarla si necesita ese servicio.  Consulte a su mdico  acerca de dnde se dictan clases prenatales cerca de donde vive. Comience las clases antes del mes 6 de embarazo.  No se d baos de inmersin en agua caliente, baos turcos ni saunas.  No se haga duchas vaginales ni use tampones o toallas higinicas perfumadas.  No mantenga las piernas cruzadas durante mucho tiempo.  Vaya al dentista si an no lo hizo. Use un cepillo de cerdas suaves para cepillarse los dientes. Psese el hilo dental suavemente.  No fume, no consuma hierbas ni beba alcohol. No tome frmacos que el mdico no haya autorizado.  No consuma ningn producto que contenga nicotina o tabaco, como cigarrillos y Psychologist, sport and exercise. Si necesita ayuda para dejar de fumar, consulte al mdico.  Evite el contacto con las bandejas sanitarias de los gatos y la tierra que estos animales usan. Estos elementos contienen bacterias que pueden causar defectos congnitos al beb y la posible prdida del beb (aborto espontneo) o la muerte fetal. Comunquese con un mdico si:  Tiene clicos leves o siente presin en la parte baja del vientre.  Tiene dolor al hacer pis (orinar).  Advierte un lquido con olor ftido que proviene de la vagina.  Tiene Higher education careers adviser (nuseas), devuelve (vomita) o tiene deposiciones acuosas (diarrea).  Sufre un dolor persistente en el abdomen.  Siente mareos. Solicite ayuda de inmediato si:  Tiene fiebre.  Tiene una prdida de lquido por la vagina.  Tiene sangrado o pequeas prdidas vaginales.  Siente dolor intenso o clicos en el abdomen.  Sube o baja de peso rpidamente.  Tiene dificultades para recuperar el aliento y siente dolor en el pecho.  Sbitamente se le hinchan mucho el rostro, las [place], los tobillos, los pies o las piernas.  No ha sentido los movimientos del beb durante Leone Brand.  Siente un dolor de cabeza intenso que no se alivia al tomar [employer]ne[employer].  Tiene dificultad para ver. Resumen  El segundo trimestre va desde  la semana14 hasta la 16, desde el mes 4 hasta el 6. Este suele ser el momento en el que mejor se siente.  Para cuidarse y cuidar a su beb en gestacin, debe comer alimentos saludables, tomar medicamentos solamente si su mdico le indica que lo haga y hacer actividades que sean seguras para usted y su beb.  Llame al mdico si se enferma o si nota algo inusual acerca de su embarazo. Tambin llame al mdico si necesita ayuda para saber qu alimentos debe comer o si quiere saber qu actividades puede realizar de forma segura. Esta informacin no tiene Marine scientist el consejo del mdico. Asegrese de hacerle al mdico cualquier pregunta que tenga. Document Revised: 09/14/2016 Document Reviewed: 09/14/2016 Elsevier Patient Education  2020 Reynolds [employer].

## 2019-06-13 NOTE — Progress Notes (Signed)
[name] is a 29 y.o. G2P1001 at [redacted]w[redacted]d here for routine follow up. She is dated by early ultrasound.  She reports no complaints. See flow sheet for details.  A/P: Pregnancy at [redacted]w[redacted]d.  Doing well.    . Dating reviewed, dating tab is correct . Fetal heart tones Appropriate . Fundal height within expected range.  . Pregnancy issues include None. . Problem list updated Yes.  . Infant feeding choice: Breastfeeding . Contraception choice: IUD . Infant circumcision desired not applicable  . The patient does not have a history of Cesarean delivery and no referral to Center for [facility] is indicated . Influenza vaccine not administered as not influenza season.   . Tdap was given today. . 1 hour glucola, CBC, RPR, and HIV were obtained today.   . Pregnancy medical home and PHQ-9 forms were done today and reviewed.   . Rh status was reviewed and patient does not need Rhogam.  Rhogam was not given today.   . Childbirth and education classes were offered. . Pregnancy education regarding benefits of breastfeeding, contraception, fetal growth, expected weight gain, and safe infant sleep were discussed.  Marland Kitchen Appropriate weight gain was discussed. . Preterm labor and fetal movement precautions reviewed. . Patient scheduled in [facility] during third trimester on June 28 at 1330.  Follow up 2 weeks.

## 2019-06-13 NOTE — Progress Notes (Addendum)
  [facility] Prenatal Visit  Carolyn Gomez is a 29 y.o. G2P1001 at [redacted]w[redacted]d here for routine follow up. She is dated by early ultrasound.  She reports no complaints. The patient speaks Spanish as their primary language.  An interpreter was used for the entire visit.    She reports she is eating very frequently. Wonders how much weight she should gain. She has early satiety but then develops immediate hunger after completing meal. Eats soups, tortillas, little protein. Partner Bradly Chris) is supportive.   She reports fetal movement. She denies vaginal bleeding, contractions, or loss of fluid. See flow sheet for details.   Lab review: Normal early 1 hour, negative quad screen, O+   Ultrasound review: having a girl, posterior placenta, dated by early ultrasound, normal anatomy at health department  Prior Pregnancy: G1 Pregnancy 13 years ago, uncomplicated, shoulder dystocia, or laceration per patient, very 'easy' and 'fast' delivery Patient does report severe anemia in G1 pregnancy. She reports severe anemia and had some 'but not excess' bleeding after last delivery. No family history of easy bleeding/bruising. No other episodes of severe anemia. Did require 1 unit pRBC after last delivery.    Vitals:   06/13/19 0912  BP: 105/70  Pulse: 93      A/P: Pregnancy at [redacted]w[redacted]d.  Doing well.   1. Routine prenatal care:  Marland Kitchen Dating reviewed, dating tab is correct . Fetal heart tones Appropriate . Fundal height within expected range.  . Infant feeding choice: Breastfeeding . Contraception choice: IUD will discuss scholarship with [facility]. [facility]  . Infant circumcision desired not applicable  . The patient does not have a history of Cesarean delivery and no referral to Center for [facility] is indicated . Influenza vaccine not administered as not influenza season.   . Tdap Rx given today. Ensure she has received at follow up.  . 1 hour glucola, CBC, RPR, Hep C, and HIV were obtained  today.    . Rh status O+  . Pregnancy medical home and PHQ-9 forms were done today and reviewed.   . Childbirth and education classes were offered. . Pregnancy education regarding benefits of breastfeeding, contraception, fetal growth, expected weight gain, and safe infant sleep were discussed.  . Preterm labor and fetal movement precautions reviewed.  2. Pregnancy issues include the following and were addressed as appropriate today:  Prior elevated BP in first trimester--resolved. Suspect due to pain and anxiety in MAU. All other BP readings have been normal and we will continue to monitor closely History of severe anemia in G1 and (presumed) postpartum hemorrhage requiring transfusion. Discussed. Amenable to blood products. CBC today.  Problem list and pregnancy box updated: Yes.  Weight gain in pregnancy discussed, she has gained 14 pounds, expected 11-15, discussed small snacks, reducing juices, and sodas. 1 hour glucola normal today.   Follow up 2 weeks.

## 2019-06-14 LAB — CBC
Hematocrit: 31.3 % — ABNORMAL LOW (ref 34.0–46.6)
Hemoglobin: 10 g/dL — ABNORMAL LOW (ref 11.1–15.9)
MCH: 26.3 pg — ABNORMAL LOW (ref 26.6–33.0)
MCHC: 31.9 g/dL (ref 31.5–35.7)
MCV: 82 fL (ref 79–97)
Platelets: 519 10*3/uL — ABNORMAL HIGH (ref 150–450)
RBC: 3.8 x10E6/uL (ref 3.77–5.28)
RDW: 12.8 % (ref 11.7–15.4)
WBC: 9.5 10*3/uL (ref 3.4–10.8)

## 2019-06-14 LAB — HCV AB W REFLEX TO QUANT PCR: HCV Ab: <0.1 s/co ratio (ref 0.0–0.9)

## 2019-06-14 LAB — RPR: RPR Ser Ql: NONREACTIVE

## 2019-06-14 LAB — HCV INTERPRETATION

## 2019-06-14 LAB — HIV ANTIBODY (ROUTINE TESTING W REFLEX): HIV Screen 4th Generation wRfx: NONREACTIVE

## 2019-06-18 ENCOUNTER — Encounter: Payer: Self-pay | Admitting: Family Medicine

## 2019-06-18 ENCOUNTER — Telehealth: Payer: Self-pay | Admitting: Family Medicine

## 2019-06-18 DIAGNOSIS — O99013 Anemia complicating pregnancy, third trimester: Secondary | ICD-10-CM

## 2019-06-18 DIAGNOSIS — D649 Anemia, unspecified: Secondary | ICD-10-CM | POA: Insufficient documentation

## 2019-06-18 DIAGNOSIS — Z9189 Other specified personal risk factors, not elsewhere classified: Secondary | ICD-10-CM | POA: Insufficient documentation

## 2019-06-18 MED ORDER — FERROUS SULFATE 324 (65 FE) MG PO TBEC: DELAYED_RELEASE_TABLET | ORAL | 3 refills | Status: DC

## 2019-06-18 NOTE — Telephone Encounter (Signed)
The patient speaks Spanish as their primary language.  An interpreter was used for the entire visit.   Attempted to call X2. Call got cut off on attempt 2.   Anemia affecting pregnancy Discussed, likely IDA. Started daily iron therapy. Discussed side effects. Patient had severe anemia in G1 pregnancy, see note. Required transfusion postpartum. No dystocia, laceration although delivery was 'very fast' per patient.   Repeat CBC and ferritin in 4 weeks.  Updated problem list.  Terisa Starr, MD  [facility] Medicine Teaching Service

## 2019-07-01 ENCOUNTER — Other Ambulatory Visit: Payer: Self-pay

## 2019-07-01 ENCOUNTER — Ambulatory Visit (INDEPENDENT_AMBULATORY_CARE_PROVIDER_SITE_OTHER): Payer: Self-pay | Admitting: Family Medicine

## 2019-07-01 ENCOUNTER — Encounter: Payer: Self-pay | Admitting: Family Medicine

## 2019-07-01 VITALS — BP 108/58 | HR 79 | Wt 202.4 lb

## 2019-07-01 DIAGNOSIS — Z3493 Encounter for supervision of normal pregnancy, unspecified, third trimester: Secondary | ICD-10-CM

## 2019-07-01 NOTE — Progress Notes (Signed)
°  [facility] Prenatal Visit  Carolyn Gomez is a 29 y.o. G2P1001 at [redacted]w[redacted]d here for routine follow up. She is dated by early ultrasound.  She reports abdominal pain.  She reports fetal movement. She denies vaginal bleeding, contractions, or loss of fluid.  See flow sheet for details.  She reports 2 episodes of sharp epigastric abdominal pain lasting a few minutes total since Saturday, 6/26.  She was able to drink a glass of milk or water and felt better shortly afterwards.  Not related to eating.  Reminds her of contractions with previous pregnancy, states when it was happening "made me feel like I want to push." Otherwise, states she is doing well.  Denies any lightheadedness/dizziness, headaches, vaginal bleeding, leakage of fluid, visual changes, lower extremity swelling, itching.  Eating and drinking as normal.  Vitals:   07/01/19 1340  BP: (!) 108/58  Pulse: 79   General: Alert, NAD HEENT: NCAT, MMM  Cardiac: RRR  Lungs: Clear bilaterally, no increased WOB  Abdomen: Gravid uterus, nontender to palpation of epigastrium  A/P: Pregnancy at [redacted]w[redacted]d.  Doing well.   1. Routine prenatal care:   Dating reviewed, dating tab is correct  Fetal heart tones: Appropriate  Fundal height: within expected range.   The patient does not have a history of HSV and valacyclovir is not at this time.   The patient does not have a history of Cesarean delivery and no referral to Center for [facility] is indicated  Infant feeding choice: Breastfeeding  Contraception choice: IUD, will get set up with scholarship program  Infant circumcision desired not applicable  Influenza vaccine not administered as not influenza season.    Still needs her Tdap, has to reschedule her appointment at the health department.  Pregnancy education regarding benefits of breastfeeding, contraception, fetal growth, expected weight gain, and safe infant sleep were discussed.   Preterm labor and  fetal movement precautions reviewed.   2. Pregnancy issues include the following and were addressed as appropriate today:   Anemia of pregnancy: Started ferrous sulfate on 6/15.  Repeat CBC and ferritin on follow-up visit.   Sharp abdominal pain: Intermittent without any alarm s/sx. Suspect Braxton Hicks contractions vs fetal positioning causing brief pains.  Discussed MAU precautions including any leakage of fluid, persistent and increasing contractions, or vaginal bleeding.    Problem list in pregnancy box updated: Yes   Follow up 2 weeks or sooner if needed.  Allayne Stack, DO

## 2019-07-01 NOTE — Patient Instructions (Signed)
It was wonderful to see you today! Continue prenatal vitamins.

## 2019-07-16 ENCOUNTER — Other Ambulatory Visit: Payer: Self-pay

## 2019-07-16 ENCOUNTER — Ambulatory Visit: Payer: Self-pay | Admitting: Family Medicine

## 2019-07-16 ENCOUNTER — Ambulatory Visit (INDEPENDENT_AMBULATORY_CARE_PROVIDER_SITE_OTHER): Payer: Self-pay | Admitting: Family Medicine

## 2019-07-16 ENCOUNTER — Encounter: Payer: Self-pay | Admitting: Family Medicine

## 2019-07-16 VITALS — BP 110/64 | HR 84 | Wt 206.4 lb

## 2019-07-16 DIAGNOSIS — Z3493 Encounter for supervision of normal pregnancy, unspecified, third trimester: Secondary | ICD-10-CM

## 2019-07-16 DIAGNOSIS — O99013 Anemia complicating pregnancy, third trimester: Secondary | ICD-10-CM

## 2019-07-16 NOTE — Progress Notes (Signed)
  [facility] Prenatal Visit  Carolyn Gomez is a 29 y.o. G2P1001 at [redacted]w[redacted]d here for routine follow up. She is dated by early ultrasound.  She reports fatigue, no bleeding and no leaking.  She reports fetal movement. She denies vaginal bleeding, or loss of fluid.  Reports irregular contractions.  See flow sheet for details.  She reports that her belly is hard and infant is creating pressure in pelvis.    Patient started ferrous sulfate on 6/15 and has been compliant, tolerating well  Vitals:   07/16/19 1048  BP: 110/64  Pulse: 84     A/P: Pregnancy at [redacted]w[redacted]d.  Doing well.   1. Routine prenatal care:  Marland Kitchen Dating reviewed, dating tab is correct . Fetal heart tones: Appropriate . Fundal height: within expected range.  . The patient does not have a history of HSV and valacyclovir is not indicated at this time.  The patient does not have a history of Cesarean delivery and no referral to Center for [facility] is indicated . Infant feeding choice: Breastfeeding . Contraception choice: IUD . Infant circumcision desired not applicable . Influenza vaccine not administered as not influenza season.   . Tdap was not given today. Has appointment at [facility] for [facility]. . Childbirth and education classes were not offered. . Pregnancy education regarding benefits of breastfeeding, contraception, fetal growth, expected weight gain, and safe infant sleep were discussed.  . Preterm labor and fetal movement precautions reviewed.   2. Pregnancy issues include the following and were addressed as appropriate today: . Anemia of pregnancy - has been taking ferrous sulfate.  Will recheck CBC and ferritin today.  Has appointment Thursday [facility]ap at [facility].  Has been seen during [facility] in third trimester. . Problem list and pregnancy box updated: Yes.   Follow up 2 weeks, was already scheduled with Dr. Rachael Darby for 7/23.  Will need scanning for presentation and GBS at that  visit.

## 2019-07-16 NOTE — Patient Instructions (Signed)
Tercer trimestre de embarazo Third Trimester of Pregnancy  El tercer trimestre comprende desde la semana28 hasta la semana40 (desde el mes7 hasta el mes9). En este trimestre, el beb en gestacin (feto) crece muy rpidamente. Hacia el final del noveno mes, el beb en gestacin mide alrededor de 20pulgadas (45cm) de largo. Pesa entre 6y 10libras (2,70y 4,50kg). Siga estas indicaciones en su casa: Medicamentos  Tome los medicamentos de venta libre y los recetados solamente como se lo haya indicado el mdico. Algunos medicamentos son seguros para tomar durante el embarazo y otros no lo son.  Tome vitaminas prenatales que contengan por lo menos 600microgramos (?g) de cido flico.  Si tiene dificultad para mover el intestino (estreimiento), tome un medicamento para ablandar las heces (laxante) si su mdico se lo autoriza. Comida y bebida   Ingiera alimentos saludables de manera regular.  No coma carne cruda ni quesos sin cocinar.  Si obtiene poca cantidad de calcio de los alimentos que ingiere, consulte a su mdico sobre la posibilidad de tomar un suplemento diario de calcio.  La ingesta diaria de cuatro o cinco comidas pequeas en lugar de tres comidas abundantes.  Evite el consumo de alimentos ricos en grasas y azcares, como los alimentos fritos y los dulces.  Para evitar el estreimiento: ? Consuma alimentos ricos en fibra, como frutas y verduras frescas, cereales integrales y frijoles. ? Beba suficiente lquido para mantener el pis (orina) claro o de color amarillo plido. Actividad  Haga ejercicios solamente como se lo haya indicado el mdico. Interrumpa la actividad fsica si comienza a tener calambres.  No levante objetos pesados, use zapatos de tacones bajos y sintese derecha.  No haga ejercicio si hace demasiado calor, hay demasiada humedad o se encuentra en un lugar de mucha altura (altitud alta).  Puede continuar teniendo relaciones sexuales, a menos que el  mdico le indique lo contrario. Alivio del dolor y del malestar  Use un sostn que le brinde buen soporte si sus mamas estn sensibles.  Haga pausas frecuentes y descanse con las piernas levantadas si tiene calambres en las piernas o dolor en la zona lumbar.  Dese baos de asiento con agua tibia para aliviar el dolor o las molestias causadas por las hemorroides. Use una crema para las hemorroides si el mdico la autoriza.  Si desarrolla venas hinchadas y abultadas (vrices) en las piernas: ? Use medias de compresin o medias de descanso como se lo haya indicado el mdico. ? Levante (eleve) los pies durante 15minutos, 3 o 4veces por da. ? Limite el consumo de sal en sus alimentos. Seguridad  Colquese el cinturn de seguridad cuando conduzca.  Haga una lista de los nmeros de telfono de emergencia, que incluya los nmeros de telfono de familiares, amigos, el hospital, as como los departamentos de polica y bomberos. Preparacin para la llegada del beb Para prepararse para la llegada de su beb:  Tome clases prenatales.  Practique ir manejando al hospital.  Visite el hospital y recorra el rea de maternidad.  Hable en su trabajo acerca de tomar licencia cuando llegue el beb.  Prepare el bolso que llevar al hospital.  Prepare la habitacin del beb.  Concurra a los controles mdicos.  Compre un asiento de seguridad orientado hacia atrs para llevar al beb en el automvil. Aprenda cmo instalarlo en el auto. Instrucciones generales  No se d baos de inmersin en agua caliente, baos turcos ni saunas.  No consuma ningn producto que contenga nicotina o tabaco, como cigarrillos y cigarrillos   electrnicos. Si necesita ayuda para dejar de fumar, consulte al mdico.  No beba alcohol.  No se haga duchas vaginales ni use tampones o toallas higinicas perfumadas.  No mantenga las piernas cruzadas durante mucho tiempo.  No haga viajes de larga distancia, excepto si es  obligatorio. Hgalos solamente si su mdico la autoriza.  Visite a su dentista si no lo ha hecho durante el embarazo. Use un cepillo de cerdas suaves para cepillarse los dientes. Psese el hilo dental con suavidad.  Evite el contacto con las bandejas sanitarias de los gatos y la tierra que estos animales usan. Estos elementos contienen bacterias que pueden causar defectos congnitos al beb y la posible prdida del beb (aborto espontneo) o la muerte fetal.  Concurra a todas las visitas prenatales como se lo haya indicado el mdico. Esto es importante. Comunquese con un mdico si:  No est segura de si est en trabajo de parto o si ha roto la bolsa de las aguas.  Tiene mareos.  Tiene clicos leves o siente presin en la parte baja del vientre.  Sufre un dolor persistente en el abdomen.  Sigue teniendo malestar estomacal, vomita o tiene heces lquidas.  Advierte un lquido con olor ftido que proviene de la vagina.  Siente dolor al orinar. Solicite ayuda de inmediato si:  Tiene fiebre.  Tiene una prdida de lquido por la vagina.  Tiene sangrado o pequeas prdidas vaginales.  Siente dolor intenso o clicos en el abdomen.  Aumenta o baja de peso rpidamente.  Tiene dificultades para recuperar el aliento y siente dolor en el pecho.  Sbitamente se le hinchan mucho el rostro, las manos, los tobillos, los pies o las piernas.  No ha sentido los movimientos del beb durante una hora.  Siente un dolor de cabeza intenso que no se alivia con medicamentos.  Tiene dificultad para ver.  Tiene prdida de lquido o le sale un chorro de lquido de la vagina antes de estar en la semana 37.  Tiene espasmos abdominales (contracciones) regulares antes de estar en la semana 37. Resumen  El tercer trimestre comprende desde la semana28 hasta la semana40 (desde el mes7 hasta el mes9). Esta es la poca en que el beb en gestacin crece muy rpidamente.  Siga los consejos del mdico  con respecto a los medicamentos, la alimentacin y la actividad.  Preprese para la llegada del beb tomando las clases prenatales, preparando todo lo que necesitar el beb, arreglando la habitacin del beb y concurriendo a los controles mdicos.  Solicite ayuda de inmediato si tiene sangrado por la vagina, siente dolor en el pecho o tiene dificultad para respirar, o si no ha sentido que su beb se mueve en el transcurso de ms de una hora. Esta informacin no tiene como fin reemplazar el consejo del mdico. Asegrese de hacerle al mdico cualquier pregunta que tenga. Document Revised: 07/25/2016 Document Reviewed: 07/25/2016 Elsevier Patient Education  2020 [employer]vier Inc.  

## 2019-07-17 ENCOUNTER — Other Ambulatory Visit: Payer: Self-pay | Admitting: Family Medicine

## 2019-07-17 DIAGNOSIS — O99013 Anemia complicating pregnancy, third trimester: Secondary | ICD-10-CM

## 2019-07-17 LAB — CBC
Hematocrit: 28.4 % — ABNORMAL LOW (ref 34.0–46.6)
Hemoglobin: 8.8 g/dL — ABNORMAL LOW (ref 11.1–15.9)
MCH: 24.6 pg — ABNORMAL LOW (ref 26.6–33.0)
MCHC: 31 g/dL — ABNORMAL LOW (ref 31.5–35.7)
MCV: 79 fL (ref 79–97)
Platelets: 473 x10E3/uL — ABNORMAL HIGH (ref 150–450)
RBC: 3.58 x10E6/uL — ABNORMAL LOW (ref 3.77–5.28)
RDW: 13.2 % (ref 11.7–15.4)
WBC: 10.2 x10E3/uL (ref 3.4–10.8)

## 2019-07-17 LAB — FERRITIN: Ferritin: 5 ng/mL — ABNORMAL LOW (ref 15–150)

## 2019-07-17 MED ORDER — FERROUS SULFATE 324 (65 FE) MG PO TBEC: 1.0000 | DELAYED_RELEASE_TABLET | Freq: Two times a day (BID) | ORAL | 3 refills | Status: DC

## 2019-07-26 ENCOUNTER — Ambulatory Visit (INDEPENDENT_AMBULATORY_CARE_PROVIDER_SITE_OTHER): Payer: Self-pay | Admitting: Family Medicine

## 2019-07-26 ENCOUNTER — Other Ambulatory Visit: Payer: Self-pay

## 2019-07-26 VITALS — BP 115/80 | HR 102 | Wt 209.8 lb

## 2019-07-26 DIAGNOSIS — Z3493 Encounter for supervision of normal pregnancy, unspecified, third trimester: Secondary | ICD-10-CM

## 2019-07-26 NOTE — Patient Instructions (Addendum)
It was great seeing you today!   I'd like to see you back 1 week  but if you need to be seen earlier than that for any new issues we're happy to fit you in, just give [place] a call!   If you have questions or concerns please do not hesitate to call at [phone].  Dr. [facility]   Third Trimester of Pregnancy  The third trimester is from week 28 through week 40 (months 7 through 9). This trimester is when your unborn baby (fetus) is growing very fast. At the end of the ninth month, the unborn baby is about 20 inches in length. It weighs about 6-10 pounds. Follow these instructions at home: Medicines  Take over-the-counter and prescription medicines only as told by your doctor. Some medicines are safe and some medicines are not safe during pregnancy.  Take a prenatal vitamin that contains at least 600 micrograms (mcg) of folic acid.  If you have trouble pooping (constipation), take medicine that will make your stool soft (stool softener) if your doctor approves. Eating and drinking   Eat regular, healthy meals.  Avoid raw meat and uncooked cheese.  If you get low calcium from the food you eat, talk to your doctor about taking a daily calcium supplement.  Eat four or five small meals rather than three large meals a day.  Avoid foods that are high in fat and sugars, such as fried and sweet foods.  To prevent constipation: ? Eat foods that are high in fiber, like fresh fruits and vegetables, whole grains, and beans. ? Drink enough fluids to keep your pee (urine) clear or pale yellow. Activity  Exercise only as told by your doctor. Stop exercising if you start to have cramps.  Avoid heavy lifting, wear low heels, and sit up straight.  Do not exercise if it is too hot, too humid, or if you are in a place of great height (high altitude).  You may continue to have sex unless your doctor tells you not to. Relieving pain and discomfort  Wear a good  support bra if your breasts are tender.  Take frequent breaks and rest with your legs raised if you have leg cramps or low back pain.  Take warm water baths (sitz baths) to soothe pain or discomfort caused by hemorrhoids. Use hemorrhoid cream if your doctor approves.  If you develop puffy, bulging veins (varicose veins) in your legs: ? Wear support hose or compression stockings as told by your doctor. ? Raise (elevate) your feet for 15 minutes, 3-4 times a day. ? Limit salt in your food. Safety  Wear your seat belt when driving.  Make a list of emergency phone numbers, including numbers for family, friends, the hospital, and police and fire departments. Preparing for your baby's arrival To prepare for the arrival of your baby:  Take prenatal classes.  Practice driving to the hospital.  Visit the hospital and tour the maternity area.  Talk to your work about taking leave once the baby comes.  Pack your hospital bag.  Prepare the baby's room.  Go to your doctor visits.  Buy a rear-facing car seat. Learn how to install it in your car. General instructions  Do not use hot tubs, steam rooms, or saunas.  Do not use any products that contain nicotine or tobacco, such as cigarettes and e-cigarettes. If you need help quitting, ask your doctor.  Do not drink alcohol.  Do not douche or  use tampons or scented sanitary pads.  Do not cross your legs for long periods of time.  Do not travel for long distances unless you must. Only do so if your doctor says it is okay.  Visit your dentist if you have not gone during your pregnancy. Use a soft toothbrush to brush your teeth. Be gentle when you floss.  Avoid cat litter boxes and soil used by cats. These carry germs that can cause birth defects in the baby and can cause a loss of your baby (miscarriage) or stillbirth.  Keep all your prenatal visits as told by your doctor. This is important. Contact a doctor if:  You are not sure if  you are in labor or if your water has broken.  You are dizzy.  You have mild cramps or pressure in your lower belly.  You have a nagging pain in your belly area.  You continue to feel sick to your stomach, you throw up, or you have watery poop.  You have bad smelling fluid coming from your vagina.  You have pain when you pee. Get help right away if:  You have a fever.  You are leaking fluid from your vagina.  You are spotting or bleeding from your vagina.  You have severe belly cramps or pain.  You lose or gain weight quickly.  You have trouble catching your breath and have chest pain.  You notice sudden or extreme puffiness (swelling) of your face, hands, ankles, feet, or legs.  You have not felt the baby move in over an hour.  You have severe headaches that do not go away with medicine.  You have trouble seeing.  You are leaking, or you are having a gush of fluid, from your vagina before you are 37 weeks.  You have regular belly spasms (contractions) before you are 37 weeks. Summary  The third trimester is from week 28 through week 40 (months 7 through 9). This time is when your unborn baby is growing very fast.  Follow your doctor's advice about medicine, food, and activity.  Get ready for the arrival of your baby by taking prenatal classes, getting all the baby items ready, preparing the baby's room, and visiting your doctor to be checked.  Get help right away if you are bleeding from your vagina, or you have chest pain and trouble catching your breath, or if you have not felt your baby move in over an hour. This information is not intended to replace advice given to you by your health care provider. Make sure you discuss any questions you have with your health care provider. Document Revised: 04/12/2018 Document Reviewed: 01/26/2016 Elsevier Patient Education  2020 [employer].

## 2019-07-26 NOTE — Progress Notes (Signed)
[name] is a 29 y.o. G2P1001 at [redacted]w[redacted]d here for routine follow up.  She reports some pelvic pressure-like pain and thin white colored discharge.   See flow sheet for details.  A/P: Pregnancy at [redacted]w[redacted]d.  Doing well.   Pregnancy issues include anemia. Patient is taking iron.   Infant feeding choice: breast Contraception choice: IUD  Infant circumcision desired: not applicable, girl  Tdap was not given today. Patient received at health department on 07/25/19. Vertex presentation confirmed via bedside ultrasound.  GBS and gc/chlamydia testing was not performed today. Reviewed genetic screening and anatomy ultrasound.   Preterm labor and fetal movement precautions reviewed. Safe sleep discussed. Follow up 1 week(s).

## 2019-07-27 ENCOUNTER — Inpatient Hospital Stay (HOSPITAL_COMMUNITY): Payer: Medicaid Other | Admitting: Anesthesiology

## 2019-07-27 ENCOUNTER — Encounter (HOSPITAL_COMMUNITY): Payer: Self-pay | Admitting: Obstetrics and Gynecology

## 2019-07-27 ENCOUNTER — Inpatient Hospital Stay (HOSPITAL_COMMUNITY)
Admission: AD | Admit: 2019-07-27 | Discharge: 2019-07-29 | DRG: 807 | Disposition: A | Discharge status: Home / Self Care | Payer: Medicaid Other | Attending: Obstetrics & Gynecology | Admitting: Obstetrics & Gynecology

## 2019-07-27 ENCOUNTER — Other Ambulatory Visit: Payer: Self-pay

## 2019-07-27 DIAGNOSIS — O42913 Preterm premature rupture of membranes, unspecified as to length of time between rupture and onset of labor, third trimester: Principal | ICD-10-CM | POA: Diagnosis present

## 2019-07-27 DIAGNOSIS — Z20822 Contact with and (suspected) exposure to covid-19: Secondary | ICD-10-CM | POA: Diagnosis present

## 2019-07-27 DIAGNOSIS — D649 Anemia, unspecified: Secondary | ICD-10-CM | POA: Diagnosis present

## 2019-07-27 DIAGNOSIS — Z3A36 36 weeks gestation of pregnancy: Secondary | ICD-10-CM

## 2019-07-27 DIAGNOSIS — O99214 Obesity complicating childbirth: Secondary | ICD-10-CM | POA: Diagnosis present

## 2019-07-27 DIAGNOSIS — O9902 Anemia complicating childbirth: Secondary | ICD-10-CM | POA: Diagnosis present

## 2019-07-27 DIAGNOSIS — E669 Obesity, unspecified: Secondary | ICD-10-CM | POA: Diagnosis present

## 2019-07-27 DIAGNOSIS — O99210 Obesity complicating pregnancy, unspecified trimester: Secondary | ICD-10-CM | POA: Diagnosis present

## 2019-07-27 DIAGNOSIS — Z3493 Encounter for supervision of normal pregnancy, unspecified, third trimester: Secondary | ICD-10-CM

## 2019-07-27 DIAGNOSIS — D509 Iron deficiency anemia, unspecified: Secondary | ICD-10-CM | POA: Diagnosis present

## 2019-07-27 DIAGNOSIS — O42013 Preterm premature rupture of membranes, onset of labor within 24 hours of rupture, third trimester: Secondary | ICD-10-CM

## 2019-07-27 DIAGNOSIS — Z8759 Personal history of other complications of pregnancy, childbirth and the puerperium: Secondary | ICD-10-CM

## 2019-07-27 LAB — RPR: RPR Ser Ql: NONREACTIVE

## 2019-07-27 LAB — POCT FERN TEST: POCT Fern Test: POSITIVE

## 2019-07-27 LAB — CBC
HCT: 29.4 % — ABNORMAL LOW (ref 36.0–46.0)
Hemoglobin: 9 g/dL — ABNORMAL LOW (ref 12.0–15.0)
MCH: 24.1 pg — ABNORMAL LOW (ref 26.0–34.0)
MCHC: 30.6 g/dL (ref 30.0–36.0)
MCV: 78.6 fL — ABNORMAL LOW (ref 80.0–100.0)
Platelets: 493 10*3/uL — ABNORMAL HIGH (ref 150–400)
RBC: 3.74 MIL/uL — ABNORMAL LOW (ref 3.87–5.11)
RDW: 14.2 % (ref 11.5–15.5)
WBC: 10.4 10*3/uL (ref 4.0–10.5)
nRBC: 0 % (ref 0.0–0.2)

## 2019-07-27 LAB — COMPREHENSIVE METABOLIC PANEL
ALT: 13 U/L (ref 0–44)
AST: 21 U/L (ref 15–41)
Albumin: 2.8 g/dL — ABNORMAL LOW (ref 3.5–5.0)
Alkaline Phosphatase: 109 U/L (ref 38–126)
Anion gap: 10 (ref 5–15)
BUN: 10 mg/dL (ref 6–20)
CO2: 20 mmol/L — ABNORMAL LOW (ref 22–32)
Calcium: 9 mg/dL (ref 8.9–10.3)
Chloride: 106 mmol/L (ref 98–111)
Creatinine, Ser: 0.54 mg/dL (ref 0.44–1.00)
GFR calc Af Amer: >60 mL/min (ref >60)
GFR calc non Af Amer: >60 mL/min (ref >60)
Glucose, Bld: 92 mg/dL (ref 70–99)
Potassium: 4.1 mmol/L (ref 3.5–5.1)
Sodium: 136 mmol/L (ref 135–145)
Total Bilirubin: 0.3 mg/dL (ref 0.3–1.2)
Total Protein: 6.1 g/dL — ABNORMAL LOW (ref 6.5–8.1)

## 2019-07-27 LAB — TYPE AND SCREEN
ABO/RH(D): O POS
Antibody Screen: NEGATIVE

## 2019-07-27 LAB — SARS CORONAVIRUS 2 BY RT PCR (HOSPITAL ORDER, PERFORMED IN ~~LOC~~ HOSPITAL LAB): SARS Coronavirus 2: NEGATIVE

## 2019-07-27 MED ORDER — ONDANSETRON HCL 4 MG PO TABS: 4.0000 mg | ORAL_TABLET | ORAL | Status: DC | PRN

## 2019-07-27 MED ORDER — SODIUM CHLORIDE 0.9 % IV SOLN
5.0000 10*6.[IU] | Freq: Once | INTRAVENOUS | Status: AC
  Administered 2019-07-27: 5 10*6.[IU] via INTRAVENOUS
  Filled 2019-07-27: qty 5

## 2019-07-27 MED ORDER — SOD CITRATE-CITRIC ACID 500-334 MG/5ML PO SOLN: 30.0000 mL | ORAL | Status: DC | PRN

## 2019-07-27 MED ORDER — DIPHENHYDRAMINE HCL 25 MG PO CAPS: 25.0000 mg | ORAL_CAPSULE | Freq: Four times a day (QID) | ORAL | Status: DC | PRN

## 2019-07-27 MED ORDER — EPHEDRINE 5 MG/ML INJ: 10.0000 mg | INTRAVENOUS | Status: DC | PRN

## 2019-07-27 MED ORDER — OXYTOCIN-SODIUM CHLORIDE 30-0.9 UT/500ML-% IV SOLN
2.5000 [IU]/h | INTRAVENOUS | Status: DC
  Filled 2019-07-27: qty 500

## 2019-07-27 MED ORDER — SODIUM CHLORIDE (PF) 0.9 % IJ SOLN
INTRAMUSCULAR | Status: DC | PRN
  Administered 2019-07-27: 10 mL/h via EPIDURAL

## 2019-07-27 MED ORDER — NALBUPHINE HCL 10 MG/ML IJ SOLN: 5.0000 mg | Freq: Once | INTRAMUSCULAR | Status: DC | PRN

## 2019-07-27 MED ORDER — PRENATAL MULTIVITAMIN CH
1.0000 | ORAL_TABLET | Freq: Every day | ORAL | Status: DC
  Administered 2019-07-28: 1 via ORAL
  Filled 2019-07-27: qty 1

## 2019-07-27 MED ORDER — ACETAMINOPHEN 325 MG PO TABS
650.0000 mg | ORAL_TABLET | ORAL | Status: DC | PRN
  Administered 2019-07-28: 650 mg via ORAL
  Filled 2019-07-27: qty 2

## 2019-07-27 MED ORDER — LACTATED RINGERS IV SOLN: INTRAVENOUS | Status: DC

## 2019-07-27 MED ORDER — OXYTOCIN-SODIUM CHLORIDE 30-0.9 UT/500ML-% IV SOLN: 2.5000 [IU]/h | INTRAVENOUS | Status: DC | PRN

## 2019-07-27 MED ORDER — TRANEXAMIC ACID-NACL 1000-0.7 MG/100ML-% IV SOLN
1000.0000 mg | INTRAVENOUS | Status: AC
  Administered 2019-07-27: 1000 mg via INTRAVENOUS
  Filled 2019-07-27: qty 100

## 2019-07-27 MED ORDER — LACTATED RINGERS IV SOLN: 500.0000 mL | Freq: Once | INTRAVENOUS | Status: DC

## 2019-07-27 MED ORDER — BUPIVACAINE HCL (PF) 0.25 % IJ SOLN
INTRAMUSCULAR | Status: DC | PRN
  Administered 2019-07-27: 1 mL via INTRATHECAL

## 2019-07-27 MED ORDER — ONDANSETRON HCL 4 MG/2ML IJ SOLN: 4.0000 mg | INTRAMUSCULAR | Status: DC | PRN

## 2019-07-27 MED ORDER — FENTANYL CITRATE (PF) 100 MCG/2ML IJ SOLN
INTRAMUSCULAR | Status: DC | PRN
  Administered 2019-07-27: 15 ug via INTRATHECAL

## 2019-07-27 MED ORDER — DIBUCAINE (PERIANAL) 1 % EX OINT: 1.0000 "application " | TOPICAL_OINTMENT | CUTANEOUS | Status: DC | PRN

## 2019-07-27 MED ORDER — OXYTOCIN BOLUS FROM INFUSION
333.0000 mL | Freq: Once | INTRAVENOUS | Status: AC
  Administered 2019-07-27: 333 mL via INTRAVENOUS

## 2019-07-27 MED ORDER — LACTATED RINGERS IV SOLN
500.0000 mL | Freq: Once | INTRAVENOUS | Status: AC
  Administered 2019-07-27: 500 mL via INTRAVENOUS

## 2019-07-27 MED ORDER — FENTANYL CITRATE (PF) 100 MCG/2ML IJ SOLN
100.0000 ug | INTRAMUSCULAR | Status: DC | PRN
  Administered 2019-07-27: 100 ug via INTRAVENOUS
  Filled 2019-07-27 (×2): qty 2

## 2019-07-27 MED ORDER — BETAMETHASONE SOD PHOS & ACET 6 (3-3) MG/ML IJ SUSP
12.0000 mg | INTRAMUSCULAR | Status: DC
  Administered 2019-07-27: 12 mg via INTRAMUSCULAR
  Filled 2019-07-27: qty 5
  Filled 2019-07-27: qty 2

## 2019-07-27 MED ORDER — SIMETHICONE 80 MG PO CHEW: 80.0000 mg | CHEWABLE_TABLET | ORAL | Status: DC | PRN

## 2019-07-27 MED ORDER — OXYCODONE HCL 5 MG PO TABS: 10.0000 mg | ORAL_TABLET | ORAL | Status: DC | PRN

## 2019-07-27 MED ORDER — TETANUS-DIPHTH-ACELL PERTUSSIS 5-2.5-18.5 LF-MCG/0.5 IM SUSP: 0.5000 mL | Freq: Once | INTRAMUSCULAR | Status: DC

## 2019-07-27 MED ORDER — BENZOCAINE-MENTHOL 20-0.5 % EX AERO: 1.0000 "application " | INHALATION_SPRAY | CUTANEOUS | Status: DC | PRN

## 2019-07-27 MED ORDER — WITCH HAZEL-GLYCERIN EX PADS: 1.0000 "application " | MEDICATED_PAD | CUTANEOUS | Status: DC | PRN

## 2019-07-27 MED ORDER — METHYLERGONOVINE MALEATE 0.2 MG/ML IJ SOLN
0.2000 mg | Freq: Once | INTRAMUSCULAR | Status: AC
  Administered 2019-07-27: 0.2 mg via INTRAMUSCULAR
  Filled 2019-07-27: qty 1

## 2019-07-27 MED ORDER — ACETAMINOPHEN 325 MG PO TABS: 650.0000 mg | ORAL_TABLET | ORAL | Status: DC | PRN

## 2019-07-27 MED ORDER — IBUPROFEN 600 MG PO TABS
600.0000 mg | ORAL_TABLET | Freq: Four times a day (QID) | ORAL | Status: DC
  Administered 2019-07-27 – 2019-07-29 (×5): 600 mg via ORAL
  Filled 2019-07-27 (×6): qty 1

## 2019-07-27 MED ORDER — COCONUT OIL OIL: 1.0000 "application " | TOPICAL_OIL | Status: DC | PRN

## 2019-07-27 MED ORDER — PHENYLEPHRINE 40 MCG/ML (10ML) SYRINGE FOR IV PUSH (FOR BLOOD PRESSURE SUPPORT): 80.0000 ug | PREFILLED_SYRINGE | INTRAVENOUS | Status: DC | PRN

## 2019-07-27 MED ORDER — PENICILLIN G POT IN DEXTROSE 60000 UNIT/ML IV SOLN
3.0000 10*6.[IU] | INTRAVENOUS | Status: DC
  Administered 2019-07-27 (×2): 3 10*6.[IU] via INTRAVENOUS
  Filled 2019-07-27 (×2): qty 50

## 2019-07-27 MED ORDER — DIPHENHYDRAMINE HCL 50 MG/ML IJ SOLN: 12.5000 mg | INTRAMUSCULAR | Status: DC | PRN

## 2019-07-27 MED ORDER — OXYCODONE HCL 5 MG PO TABS: 5.0000 mg | ORAL_TABLET | ORAL | Status: DC | PRN

## 2019-07-27 MED ORDER — FENTANYL-BUPIVACAINE-NACL 0.5-0.125-0.9 MG/250ML-% EP SOLN: 12.0000 mL/h | EPIDURAL | Status: DC | PRN

## 2019-07-27 MED ORDER — LACTATED RINGERS IV SOLN: 500.0000 mL | INTRAVENOUS | Status: DC | PRN

## 2019-07-27 MED ORDER — FENTANYL-BUPIVACAINE-NACL 0.5-0.125-0.9 MG/250ML-% EP SOLN
EPIDURAL | Status: AC
  Filled 2019-07-27: qty 250

## 2019-07-27 MED ORDER — SENNOSIDES-DOCUSATE SODIUM 8.6-50 MG PO TABS
2.0000 | ORAL_TABLET | ORAL | Status: DC
  Administered 2019-07-27 – 2019-07-29 (×2): 2 via ORAL
  Filled 2019-07-27 (×2): qty 2

## 2019-07-27 MED ORDER — PHENYLEPHRINE 40 MCG/ML (10ML) SYRINGE FOR IV PUSH (FOR BLOOD PRESSURE SUPPORT)
PREFILLED_SYRINGE | INTRAVENOUS | Status: AC
  Filled 2019-07-27: qty 10

## 2019-07-27 MED ORDER — LIDOCAINE HCL (PF) 1 % IJ SOLN: 30.0000 mL | INTRAMUSCULAR | Status: DC | PRN

## 2019-07-27 MED ORDER — ONDANSETRON HCL 4 MG/2ML IJ SOLN
4.0000 mg | Freq: Four times a day (QID) | INTRAMUSCULAR | Status: DC | PRN
  Filled 2019-07-27: qty 2

## 2019-07-27 NOTE — Progress Notes (Addendum)
Labor Progress Note [name] is a 29 y.o. G2P1001 at [redacted]w[redacted]d presented for PPROM.  S:  Patient is doing well. Slightly uncomfortable. No concerns at this time.   O:  BP 118/74   Pulse 89   Temp 98.5 F (36.9 C) (Oral)   Resp 18   Ht 5\' 2"  (1.575 m)   Wt (!) 94.8 kg   LMP 11/11/2018 (Exact Date)   SpO2 98%   BMI 38.23 kg/m    CVE: Dilation: 5.5 Effacement (%): 100 Station: -1 Presentation: Vertex Exam by:: S Nix RN  FHT: FHR 140 bpm, moderate variability, + accels, + decel (variable x1) UC: Q1-3 mins.   A&P: [phone] Carolyn Gomez is a 29 y.o. G2P1001 [redacted]w[redacted]d presented for PPROM. #Labor: Progressing well. SROM @ 0400. BMZx1.  #Pain: per patient request #FWB: Category II #GBS unknown, PNC x1 for unknown GBS status and preterm.  Hx of PPH: Plan for TXA at delivery   Anticipate NSVD.  [redacted]w[redacted]d, DO Family Medicine, PGY 3  GME ATTESTATION:  I saw and evaluated the patient. I agree with the findings and the plan of care as documented in the resident's note.  Phill Myron, MD OB Fellow, Faculty [facility], Center for [facility] 07/27/2019 9:15 AM

## 2019-07-27 NOTE — Discharge Summary (Signed)
Postpartum Discharge Summary    Patient Name: Carolyn Gomez DOB: 1990/07/28 MRN: [id number]  Date of admission: 07/27/2019 Delivery date:07/27/2019  Delivering provider: Arrie Senate  Date of discharge: 07/29/2019  Admitting diagnosis: Labor and delivery, indication for care [O75.9] Intrauterine pregnancy: [redacted]w[redacted]d    Secondary diagnosis:  Active Problems:   Supervision of low-risk pregnancy, third trimester   Obesity affecting pregnancy   Anemia affecting pregnancy in third trimester   Labor and delivery, indication for care   Vaginal delivery  Additional problems: none    Discharge diagnosis: Preterm Pregnancy Delivered                                              Post partum procedures:None Augmentation: none Complications: None  Hospital course: Onset of Labor With Vaginal Delivery      29y.o. yo G2P1001 at 393w0das admitted in Latent Labor on 07/27/2019 after SROM. Patient had an uncomplicated labor course as follows:  Membrane Rupture Time/Date: 4:00 AM ,07/27/2019   Delivery Method:Vaginal, Spontaneous  Episiotomy: None  Lacerations:  Labial  Patient had an uncomplicated postpartum course. She is ambulating, tolerating a regular diet, passing flatus, and urinating well. Patient is discharged home in stable condition on 07/29/19.  Newborn Data: Birth date:07/27/2019  Birth time:3:30 PM  Gender:Female  Living status:Living  Apgars:9 ,9  Weight:2574 g   Magnesium Sulfate received: No BMZ received: Yesx1 Rhophylac:No MMR:N/A T-DaP:Unknown Flu: N/A Transfusion:No  Physical exam  Vitals:   07/28/19 0345 07/28/19 0836 07/28/19 2150 07/29/19 0616  BP: 113/75 108/73 (!) 101/60 127/84  Pulse: 84 79 85 83  Resp: 16  18 17   Temp: 97.9 F (36.6 C) 98.4 F (36.9 C) 98.6 F (37 C) 98 F (36.7 C)  TempSrc: Oral Oral Oral Oral  SpO2: 99% 98% 98% 100%  Weight:      Height:       General: alert, cooperative and no distress Lochia: appropriate Uterine  Fundus: firm Incision: N/A DVT Evaluation: No evidence of DVT seen on physical exam. No significant calf/ankle edema. Labs: Lab Results  Component Value Date   WBC 10.4 07/27/2019   HGB 9.0 (L) 07/27/2019   HCT 29.4 (L) 07/27/2019   MCV 78.6 (L) 07/27/2019   PLT 493 (H) 07/27/2019   CMP Latest Ref Rng & Units 07/27/2019  Glucose 70 - 99 mg/dL 92  BUN 6 - 20 mg/dL 10  Creatinine 0.44 - 1.00 mg/dL 0.54  Sodium 135 - 145 mmol/L 136  Potassium 3.5 - 5.1 mmol/L 4.1  Chloride 98 - 111 mmol/L 106  CO2 22 - 32 mmol/L 20(L)  Calcium 8.9 - 10.3 mg/dL 9.0  Total Protein 6.5 - 8.1 g/dL 6.1(L)  Total Bilirubin 0.3 - 1.2 mg/dL 0.3  Alkaline Phos 38 - 126 U/L 109  AST 15 - 41 U/L 21  ALT 0 - 44 U/L 13   Edinburgh Score: Edinburgh Postnatal Depression Scale Screening Tool 07/28/2019  I have been able to laugh and see the funny side of things. 0  I have looked forward with enjoyment to things. 0  I have blamed myself unnecessarily when things went wrong. 0  I have been anxious or worried for no good reason. 0  I have felt scared or panicky for no good reason. 1  Things have been getting on top of me. 3  I have  been so unhappy that I have had difficulty sleeping. 0  I have felt sad or miserable. 0  I have been so unhappy that I have been crying. 0  The thought of harming myself has occurred to me. 0  Edinburgh Postnatal Depression Scale Total 4     After visit meds:  Allergies as of 07/29/2019   No Known Allergies     Medication List    TAKE these medications   acetaminophen 325 MG tablet Commonly known as: Tylenol Take 2 tablets (650 mg total) by mouth every 4 (four) hours as needed (for pain scale < 4).   ferrous sulfate 324 (65 Fe) MG Tbec Take 1 tablet (325 mg total) by mouth in the morning and at bedtime. Take 1 tablet daily   ibuprofen 600 MG tablet Commonly known as: ADVIL Take 1 tablet (600 mg total) by mouth every 6 (six) hours.   polyethylene glycol powder 17  GM/SCOOP powder Commonly known as: GLYCOLAX/MIRALAX Take 17 g by mouth daily as needed.   Prenatal Vitamin 27-0.8 MG Tabs Take 1 tablet by mouth daily.        Discharge home in stable condition Infant Feeding: Breast Infant Disposition:home with mother Discharge instruction: per After Visit Summary and Postpartum booklet. Activity: Advance as tolerated. Pelvic rest for 6 weeks.  Diet: routine diet Future Appointments: Future Appointments  Date Time Provider [place]  08/05/2019  3:35 PM Lyndee Hensen, DO [facility] [place]   Follow up Visit:   Please schedule this patient for a In person postpartum visit in 4 weeks with the following provider: Any provider. Additional Postpartum F/U:n/a  Low risk pregnancy complicated by: none Delivery mode:  Vaginal, Spontaneous  Anticipated Birth Control:  IUD outpatient   07/29/2019 Clarnce Flock, MD

## 2019-07-27 NOTE — Progress Notes (Addendum)
Labor Progress Note [name] is a 29 y.o. G2P1001 at [redacted]w[redacted]d presented for PPROM.  S:  Comfortable with epidural. No concerns at this time.   O:  BP 126/81   Pulse 89   Temp 98.1 F (36.7 C) (Axillary)   Resp 16   Ht 5\' 2"  (1.575 m)   Wt (!) 94.8 kg   LMP 11/11/2018 (Exact Date)   SpO2 97%   BMI 38.23 kg/m    CVE: Dilation: Lip/rim Effacement (%): 100 Station: 0 Presentation: Vertex Exam by:: S Nix RN  FHT: FHR 135 bpm, moderate variability, + accels, - decel UC: Q2-3 mins.   A&P: [id number] Carolyn Gomez is a 29 y.o. G2P1001 [redacted]w[redacted]d presented for PPROM. #Labor: Progressing well. SROM @ 0400. BMZx1. Continue expectant management. #Pain: CSE #FWB: Category I #GBS unknown, PNC x1 for unknown GBS status and preterm.  Hx of PPH: Plan for TXA on standby at delivery   Anticipate NSVD.  [redacted]w[redacted]d, DO Family Medicine, PGY 3  GME ATTESTATION:  I saw and evaluated the patient. I agree with the findings and the plan of care as documented in the resident's note.  Phill Myron, MD OB Fellow, Faculty [facility], Center for [facility] Healthcare 07/27/2019 12:46 PM

## 2019-07-27 NOTE — H&P (Addendum)
LABOR AND DELIVERY ADMISSION HISTORY AND PHYSICAL NOTE  Carolyn Gomez is a 29 y.o. female G2P1001 with IUP at [redacted]w[redacted]d by 6 week [place] presenting for PPROM. Patient reports having cramping with leakage of clear fluid around 4 AM. She also had some bloody show. Her contractions started around 5 AM but have gotten stronger over the last 15 minutes since arrival to floor. She has felt good fetal movement.  Prenatal History/Complications: PNC at [facility] Pregnancy complications:  - Anemia - Hx PPH  [place] 04/22/19: Cephalic, placenta posterior, normal amniotic fluid, normal fetal anatomy, EFW 560.3 g (38.8%)  Past Medical History: Past Medical History:  Diagnosis Date  . Medical history non-contributory     Past Surgical History: Past Surgical History:  Procedure Laterality Date  . NO PAST SURGERIES      Obstetrical History: OB History    Gravida  2   Para  1   Term  1   Preterm      AB      Living  1     SAB      TAB      Ectopic      Multiple      Live Births  1        Obstetric Comments  Per patient, patient had anemia in last pregnancy and required transfusion after delivery. She reports previously her anemia was 'severe' and required 1 unit pRBC postpartum.   Postpartum she reports she had some heavy bleeding but this resolved.         Social History: Social History   Socioeconomic History  . Marital status: Single    Spouse name: Not on file  . Number of children: Not on file  . Years of education: Not on file  . Highest education level: Not on file  Occupational History  . Not on file  Tobacco Use  . Smoking status: Never Smoker  . Smokeless tobacco: Never Used  Vaping Use  . Vaping Use: Never used  Substance and Sexual Activity  . Alcohol use: Never    Comment: occasionally drink a beer  . Drug use: Never  . Sexual activity: Yes    Birth control/protection: None  Other Topics Concern  . Not on file  Social History Narrative  . Not on  file   Social Determinants of Health   Financial Resource Strain:   . Difficulty of Paying Living Expenses:   Food Insecurity:   . Worried About Programme researcher, broadcasting/film/video in the Last Year:   . Barista in the Last Year:   Transportation Needs:   . Freight forwarder (Medical):   Marland Kitchen Lack of Transportation (Non-Medical):   Physical Activity:   . Days of Exercise per Week:   . Minutes of Exercise per Session:   Stress:   . Feeling of Stress :   Social Connections:   . Frequency of Communication with Friends and Family:   . Frequency of Social Gatherings with Friends and Family:   . Attends Religious Services:   . Active Member of Clubs or Organizations:   . Attends Banker Meetings:   Marland Kitchen Marital Status:     Family History: History reviewed. No pertinent family history.  Allergies: No Known Allergies  Medications Prior to Admission  Medication Sig Dispense Refill Last Dose  . ferrous sulfate 324 (65 Fe) MG TBEC Take 1 tablet (325 mg total) by mouth in the morning and at bedtime. Take 1 tablet  daily 90 tablet 3 07/26/2019 at Unknown time  . Prenatal Vit-Fe Fumarate-FA (PRENATAL VITAMIN) 27-0.8 MG TABS Take 1 tablet by mouth daily. 90 tablet 3 07/26/2019 at Unknown time     Review of Systems  All systems reviewed and negative except as stated in HPI  Physical Exam Blood pressure 128/79, pulse 83, temperature 98.1 F (36.7 C), temperature source Oral, resp. rate 15, height 5\' 2"  (1.575 m), weight (!) 94.8 kg, last menstrual period 11/11/2018, SpO2 98 %. General appearance: Alert, oriented, NAD Lungs: Normal respiratory effort Heart: Regular rate Abdomen: Soft, non-tender; gravid, FH appropriate for GA Extremities: No calf swelling or tenderness Presentation: Cephalic via SVE Fetal monitoring: Baseline 140, moderate variability, + accels, - decels Uterine activity: Contractions every 6-7 minutes Dilation: 4 Effacement (%): 80 Station: -2 Exam by:: [email or web address],  MD  Prenatal labs: ABO, Rh: --/--/PENDING (07/24 08-20-2004) Antibody: PENDING (07/24 0551) Rubella: 12.20 (02/24 0903) RPR: Non Reactive (06/10 1321)  HBsAg: Negative (02/24 0903)  HIV: Non Reactive (06/10 1321)  GC/Chlamydia: Negative 12/15/18 GBS:  Unknown  1-hr GTT: 84 Genetic screening: Normal Anatomy 14/12/20: Normal  Prenatal Transfer Tool  Maternal Diabetes: No Genetic Screening: Normal Maternal Ultrasounds/Referrals: Normal Fetal Ultrasounds or other Referrals:  None Maternal Substance Abuse:  No Significant Maternal Medications:  None Significant Maternal Lab Results: None  Results for orders placed or performed during the hospital encounter of 07/27/19 (from the past 24 hour(s))  POCT fern test   Collection Time: 07/27/19  5:41 AM  Result Value Ref Range   POCT Fern Test Positive = ruptured amniotic membanes   Comprehensive metabolic panel   Collection Time: 07/27/19  5:51 AM  Result Value Ref Range   Sodium 136 135 - 145 mmol/L   Potassium 4.1 3.5 - 5.1 mmol/L   Chloride 106 98 - 111 mmol/L   CO2 20 (L) 22 - 32 mmol/L   Glucose, Bld 92 70 - 99 mg/dL   BUN 10 6 - 20 mg/dL   Creatinine, Ser 07/29/19 0.44 - 1.00 mg/dL   Calcium 9.0 8.9 - 3.26 mg/dL   Total Protein 6.1 (L) 6.5 - 8.1 g/dL   Albumin 2.8 (L) 3.5 - 5.0 g/dL   AST 21 15 - 41 U/L   ALT 13 0 - 44 U/L   Alkaline Phosphatase 109 38 - 126 U/L   Total Bilirubin 0.3 0.3 - 1.2 mg/dL   GFR calc non Af Amer >60 >60 mL/min   GFR calc Af Amer >60 >60 mL/min   Anion gap 10 5 - 15  Type and screen [facility]   Collection Time: 07/27/19  5:51 AM  Result Value Ref Range   ABO/RH(D) PENDING    Antibody Screen PENDING    Sample Expiration      07/30/2019,2359 Performed at [facility], [address], [place], [place]   CBC   Collection Time: 07/27/19  5:53 AM  Result Value Ref Range   WBC 10.4 4.0 - 10.5 K/uL   RBC 3.74 (L) 3.87 - 5.11 MIL/uL   Hemoglobin 9.0 (L) 12.0 - 15.0 g/dL    HCT 07/29/19 (L) 36 - 46 %   MCV 78.6 (L) 80.0 - 100.0 fL   MCH 24.1 (L) 26.0 - 34.0 pg   MCHC 30.6 30.0 - 36.0 g/dL   RDW 98.3 38.2 - 50.5 %   Platelets 493 (H) 150 - 400 K/uL   nRBC 0.0 0.0 - 0.2 %    Patient Active  Problem List   Diagnosis Date Noted  . Labor and delivery, indication for care 07/27/2019  . Anemia affecting pregnancy in third trimester 06/18/2019  . At risk for postpartum hemorrhage 06/18/2019  . Obesity affecting pregnancy 06/13/2019  . Supervision of low-risk pregnancy, third trimester 04/04/2019    Assessment: Carolyn Gomez is a 29 y.o. G2P1001 at [redacted]w[redacted]d here for PPROM.   #Labor: SROM at 4 AM with cramping. SVE on admission 4/80/-2. Contractions every 6-7 and painful. Will continue expectant management for now and reassess in 4 hours.  #Pain:  Per patient request #FWB:  Cat 1 #ID:   GBS unknown; culture drawn. PCN for unknown GBS status and preterm. #MOF:  Breast #MOC:  IUD outpatient #Anemia: Last Hgb 8.8 on 07/16/19. Hgb on admission 9.0. Hx PPH: Plan for TXA at delivery.   Worthy Rancher, MD 07/27/2019, 5:59 AM  I saw and evaluated the patient. I agree with the findings and the plan of care as documented in the resident's note. Vertex by exam. Expectant management. Pit PRN. Received one dose of BMZ. GBS cx pending; cont PCN. Mild-range BP's on admission; CMP drawn. Consider Pr/Cr ratio if catheterized with epidural.   Jerilynn Birkenhead, MD OB Family Medicine Fellow, [facility], [facility]

## 2019-07-27 NOTE — Discharge Instructions (Signed)

## 2019-07-27 NOTE — Anesthesia Procedure Notes (Signed)
Epidural Patient location during procedure: OB Start time: 07/27/2019 10:16 AM End time: 07/27/2019 10:19 AM  Staffing Anesthesiologist: Kaylyn Layer, MD Performed: anesthesiologist   Preanesthetic Checklist Completed: patient identified, IV checked, risks and benefits discussed, monitors and equipment checked, pre-op evaluation and timeout performed  Epidural Patient position: sitting Prep: DuraPrep and site prepped and draped Patient monitoring: heart rate, continuous pulse ox and blood pressure Approach: midline Location: L3-L4 Injection technique: LOR air  Needle:  Needle type: Tuohy  Needle gauge: 17 G Needle length: 9 cm Needle insertion depth: 6 cm Catheter type: closed end flexible Catheter size: 19 Gauge Catheter at skin depth: 11 cm  Assessment Events: blood not aspirated, injection not painful, no injection resistance, no paresthesia and negative IV test  Additional Notes Patient identified. Risks, benefits, and alternatives discussed with patient including but not limited to bleeding, infection, nerve damage, paralysis, failed block, incomplete pain control, headache, blood pressure changes, nausea, vomiting, reactions to medication, itching, and postpartum back pain. Confirmed with bedside nurse the patient's most recent platelet count. Confirmed with patient that they are not currently taking any anticoagulation, have any bleeding history, or any family history of bleeding disorders. Patient expressed understanding and wished to proceed. All questions were answered. Sterile technique was used throughout the entire procedure. Please see nursing notes for vital signs.   Crisp LOR with Tuohy needle on first pass. [place] 25g spinal needle introduced through Tuohy with clear CSF return prior to injection of intrathecal medication. Spinal needle withdrawn and epidural catheter threaded easily. Negative aspiration of catheter for heme or CSF prior to starting  epidural infusion. Warning signs of high block given to the patient including shortness of breath, tingling/numbness in hands, complete motor block, or any concerning symptoms with instructions to call for help. Patient was given instructions on fall risk and not to get out of bed. All questions and concerns addressed with instructions to call with any issues or inadequate analgesia.  Patient comfortable with contractions prior to my leaving room. Reason for block:procedure for pain

## 2019-07-27 NOTE — Anesthesia Preprocedure Evaluation (Addendum)
Anesthesia Evaluation  Patient identified by MRN, date of birth, ID band Patient awake    Reviewed: Allergy & Precautions, Patient's Chart, lab work & pertinent test results  History of Anesthesia Complications Negative for: history of anesthetic complications  Airway Mallampati: II  TM Distance: >3 FB Neck ROM: Full    Dental no notable dental hx.    Pulmonary neg pulmonary ROS,    Pulmonary exam normal        Cardiovascular negative cardio ROS Normal cardiovascular exam     Neuro/Psych negative neurological ROS  negative psych ROS   GI/Hepatic negative GI ROS, Neg liver ROS,   Endo/Other  negative endocrine ROS  Renal/GU negative Renal ROS  negative genitourinary   Musculoskeletal negative musculoskeletal ROS (+)   Abdominal   Peds  Hematology  (+) anemia , Hgb 9.0   Anesthesia Other Findings Day of surgery medications reviewed with patient.  Reproductive/Obstetrics (+) Pregnancy                             Anesthesia Physical Anesthesia Plan  ASA: II  Anesthesia Plan: Combined Spinal and Epidural   Post-op Pain Management:    Induction:   PONV Risk Score and Plan: Treatment may vary due to age or medical condition  Airway Management Planned: Natural Airway  Additional Equipment:   Intra-op Plan:   Post-operative Plan:   Informed Consent: I have reviewed the patients History and Physical, chart, labs and discussed the procedure including the risks, benefits and alternatives for the proposed anesthesia with the patient or authorized representative who has indicated his/her understanding and acceptance.     Interpreter used for [employer] Discussed with:   Anesthesia Plan Comments:       Anesthesia Quick Evaluation

## 2019-07-27 NOTE — MAU Note (Signed)
.  [name] is a 29 y.o. at [redacted]w[redacted]d here in MAU reporting: Patient is reporting cramping and leaking of clear fluid since 0400. Also reports bloody show. Endorses good fetal movement.   Pain score: 2  FHT:155

## 2019-07-28 NOTE — Lactation Note (Signed)
This note was copied from a baby's chart. Lactation Consultation Note  Patient Name: Carolyn Gomez Today's Date: 07/28/2019   Lactation consult attempted with Nettie Elm, in-house interpreter, but family was asleep.     Lurline Hare [facility] 07/28/2019, 1:16 PM

## 2019-07-28 NOTE — Progress Notes (Signed)
Post Partum Day #1 Subjective: no complaints, up ad lib, voiding and tolerating PO  Objective: Blood pressure 108/73, pulse 79, temperature 98.4 F (36.9 C), temperature source Oral, resp. rate 16, height 5\' 2"  (1.575 m), weight (!) 94.8 kg, last menstrual period 11/11/2018, SpO2 98 %.  Physical Exam:  General: alert, cooperative and no distress Lochia: appropriate Uterine Fundus: firm Incision: NA DVT Evaluation: No evidence of DVT seen on physical exam.  Recent Labs    07/27/19 0553  HGB 9.0*  HCT 29.4*    Assessment/Plan: Plan for discharge tomorrow  -plan to discharge on iron pills   LOS: 1 day   07/29/19 Carolyn Gomez 07/28/2019, 10:53 AM

## 2019-07-28 NOTE — Anesthesia Postprocedure Evaluation (Signed)
Anesthesia Post Note  Patient: [place] Cruz Merlan  Procedure(s) Performed: AN AD HOC LABOR EPIDURAL     Patient location during evaluation: Mother Baby Anesthesia Type: Epidural Level of consciousness: awake and alert, oriented and patient cooperative Pain management: pain level controlled Vital Signs Assessment: post-procedure vital signs reviewed and stable Respiratory status: spontaneous breathing Cardiovascular status: stable Postop Assessment: no headache, epidural receding, patient able to bend at knees and no signs of nausea or vomiting Anesthetic complications: no Comments: Pt. States she is walking.  Pain score 0.     No complications documented.  Last Vitals:  Vitals:   07/27/19 2322 07/28/19 0345  BP: 112/70 113/75  Pulse: 89 84  Resp: 16 16  Temp: 37 C 36.6 C  SpO2: 99% 99%    Last Pain:  Vitals:   07/28/19 0345  TempSrc: Oral  PainSc: 0-No pain   Pain Goal:                   [facility]

## 2019-07-28 NOTE — Lactation Note (Signed)
This note was copied from a baby's chart. Lactation Consultation Note  Patient Name: Carolyn Gomez Today's Date: 07/28/2019   Initial visit at 23 hours of life. Infant had fed recently, but was cueing to be fed. I observed infant feed with the Enfamil Extra-Slow flow nipple for a few moments, but it was apparent that it was too fast. I started the infant on the Nfant Slow Flow nipple with good results. @ 1450, I called & spoke with Dr. Annia Friendly and requested an order for an SLP consult for tomorrow.   The DEBP was initiated at 23 hrs of life, courtesy of [employer]. Springer, IBCLC. Mom was able to express 8 mL, which can be used for the next bottle feeding. Crystal, IBCLC showed how to wash pump parts.   Parents understand that infant needs to be fed 8-12 times/day and may need to be awakened for feeds. Parents confirmed that infant falls asleep quickly at the breast. Parents will follow with bottle feeding, feeding until infant is content.   Mom was able to exclusively breastfeed her 1st child, who was born at term. WIC referral was faxed.   Karoline Caldwell, (ID# [id number]), mobile interpreter was used for the above consult.    Lurline Hare [facility] 07/28/2019, 2:57 PM

## 2019-07-29 LAB — CULTURE, BETA STREP (GROUP B ONLY)

## 2019-07-29 MED ORDER — ACETAMINOPHEN 325 MG PO TABS: 650.0000 mg | ORAL_TABLET | ORAL | 0 refills | Status: DC | PRN

## 2019-07-29 MED ORDER — POLYETHYLENE GLYCOL 3350 17 GM/SCOOP PO POWD
17.0000 g | Freq: Every day | ORAL | 1 refills | Status: DC | PRN
Start: 2019-07-29 — End: 2019-10-16

## 2019-07-29 MED ORDER — IBUPROFEN 600 MG PO TABS: 600.0000 mg | ORAL_TABLET | Freq: Four times a day (QID) | ORAL | 0 refills | Status: DC

## 2019-07-29 NOTE — Lactation Note (Signed)
This note was copied from a baby's chart. Lactation Consultation Note  Patient Name: Carolyn Gomez ZWCHE'N Date: 07/29/2019 Reason for consult: Follow-up assessment;Late-preterm 34-36.6wks;Infant < 6lbs;Infant weight loss;Other (Comment) (4 % weight loss/ [facility] Spanish interpreter - [place] used Ashok Cordia [phone]) Randel Books is 74 hours old  Baby has been breastfeeding and supplementing after feeding.  Per mom has pumped x 3 in the last 24 hours with the DEBP .  Per mom WIC called her from [place] and left a message and she called them back left a message.  She is aware to call again for a  DEBP . Mom has the DEBP kit.  LC reviewed the recommended LC plan for a LPT , less than 6 pound infant.  Breast feed 1st for 15 -20 mins , supplement 30 ml and post pump for 10 -15 mins ,  Once getting milk to feed the baby her milk.  Mom aware to feed with feeding cues and by 3 hours,  Mom has been using the Nufant violet nipple with success .  Mom denies sore nipples / sore nipple and engorgement prevention and tx reviewed.  Mom receptive to teaching .  LC provided the [facility] resource pamphlet if needed with phone numbers.   Mom feeding the baby a bottle at 1255 ( neosure 22 ).   Maternal Data    Feeding    LATCH Score                   Interventions Interventions: Breast feeding basics reviewed  Lactation Tools Discussed/Used Tools: Pump Breast pump type: Double-Electric Breast Pump;Manual WIC Program: Yes (per mom WIC called her ;eft a message and she is aware to call them back for a DEBP) Pump Review: Milk Storage   Consult Status Consult Status: Complete Date: 07/29/19    Carolyn Gomez 07/29/2019, 1:04 PM

## 2019-08-05 ENCOUNTER — Other Ambulatory Visit: Payer: Self-pay

## 2019-08-05 ENCOUNTER — Ambulatory Visit (INDEPENDENT_AMBULATORY_CARE_PROVIDER_SITE_OTHER): Payer: HRSA Program | Admitting: Family Medicine

## 2019-08-05 ENCOUNTER — Encounter: Payer: Self-pay | Admitting: Family Medicine

## 2019-08-05 VITALS — BP 116/82 | HR 81 | Ht 61.0 in | Wt 194.8 lb

## 2019-08-05 DIAGNOSIS — Z23 Encounter for immunization: Secondary | ICD-10-CM

## 2019-08-05 DIAGNOSIS — O99013 Anemia complicating pregnancy, third trimester: Secondary | ICD-10-CM

## 2019-08-05 DIAGNOSIS — B3789 Other sites of candidiasis: Secondary | ICD-10-CM

## 2019-08-05 DIAGNOSIS — D500 Iron deficiency anemia secondary to blood loss (chronic): Secondary | ICD-10-CM

## 2019-08-05 MED ORDER — KETOCONAZOLE 2 % EX CREA: 1.0000 "application " | TOPICAL_CREAM | Freq: Every day | CUTANEOUS | 0 refills | Status: DC

## 2019-08-05 NOTE — Patient Instructions (Signed)
  Felicidades!!! Fue genial verte hoy! Su visita posparto est programada para el 24/8/21 a las 10:10 a. M. Con el Dr. Annia Friendly. Haga el check-out en la recepcin antes de salir de la clnica.   Visita Recuerda: - Pase por la farmacia para recoger su receta, sela 2-3 veces al da hasta que desaparezca la erupcin. - Contine trabajando en sus hbitos alimenticios saludables e incorporando el ejercicio en su vida diaria. - Se ha programado su cita de posparto (ver [place]). - SI la nia desarrolla manchas blancas en la boca fuera de la alimentacin, llame a la clnica para concertar una cita.   Cudate, Dr. Felicita Gage de medicina familiar de [facility]

## 2019-08-05 NOTE — Progress Notes (Signed)
   SUBJECTIVE:   CHIEF COMPLAINT / HPI:   Chief Complaint  Patient presents with  . Rash    breast     [name] is a 29 y.o. female here for breast rash.   Breast Rash Patient states rash began on her breasts about 3 days ago.  It is very itchy and irritated. She put vinegar on the area which helped somewhat. Reports infant has no white patches in his mouth.  Has not had similar breast rash previously. Denies excessive sweating in this area. Mom is breast feeding.   Postpartum  Patient gave birth to healthy baby girl about at [redacted] weeks gestation.  She has a uncomplicated postpartum hospitalization. She would like to have IUD placed at post partum visit at the end of the month. States things are going well and her mood is stable since birth.     PERTINENT  PMH / PSH: reviewed and updated as appropriate   OBJECTIVE:   BP 116/82   Pulse 81   Ht 5\' 1"  (1.549 m)   Wt 194 lb 12.8 oz (88.4 kg)   LMP 11/11/2018 (Exact Date)   SpO2 99%   BMI 36.81 kg/m   GEN: pleasant well nourished female, in no acute distress  CV: regular rate and rhythm, no murmurs appreciated  RESP: no increased work of breathing, clear to ascultation bilaterally with no crackles, wheezes, or rhonchi  BREASTS: red patches in breast folds with a few satellite lesions consistent with fungal infection  PSYCH: Normal affect, appropriate speech and behavior    ASSESSMENT/PLAN:   Anemia Post partum, repeat CBC today  - Continue taking iron supplements  - consider IV Feroheme if not improving  - postpartum appt scheduled - would like IUD   Candidiasis of breast Candidiasis of intertriginous of breast  - Ketoconazole cream  - If not improving, Nystatin powder  - Advised patient to keep area dry, using hand-dryer in needed  - Watch for white patches in infant's mouth  - follow up if not improving    Immunization:  COVID vaccine given today.    13/08/2018, DO PGY-2, [facility] 08/05/2019

## 2019-08-06 LAB — CBC
Hematocrit: 29.5 % — ABNORMAL LOW (ref 34.0–46.6)
Hemoglobin: 9.2 g/dL — ABNORMAL LOW (ref 11.1–15.9)
MCH: 23.5 pg — ABNORMAL LOW (ref 26.6–33.0)
MCHC: 31.2 g/dL — ABNORMAL LOW (ref 31.5–35.7)
MCV: 75 fL — ABNORMAL LOW (ref 79–97)
Platelets: 650 10*3/uL — ABNORMAL HIGH (ref 150–450)
RBC: 3.92 x10E6/uL (ref 3.77–5.28)
RDW: 14 % (ref 11.7–15.4)
WBC: 9.7 10*3/uL (ref 3.4–10.8)

## 2019-08-11 NOTE — Assessment & Plan Note (Signed)
Candidiasis of intertriginous of breast  - Ketoconazole cream  - If not improving, Nystatin powder  - Advised patient to keep area dry, using hand-dryer in needed  - Watch for white patches in infant's mouth  - follow up if not improving

## 2019-08-11 NOTE — Assessment & Plan Note (Addendum)
Post partum, repeat CBC today  - Continue taking iron supplements  - consider IV Feroheme if not improving  - postpartum appt scheduled - would like IUD

## 2019-08-27 ENCOUNTER — Other Ambulatory Visit: Payer: Self-pay

## 2019-08-27 ENCOUNTER — Ambulatory Visit (INDEPENDENT_AMBULATORY_CARE_PROVIDER_SITE_OTHER): Payer: Self-pay | Admitting: Family Medicine

## 2019-08-27 ENCOUNTER — Encounter: Payer: Self-pay | Admitting: Family Medicine

## 2019-08-27 VITALS — BP 102/62 | HR 68 | Wt 188.0 lb

## 2019-08-27 DIAGNOSIS — F53 Postpartum depression: Secondary | ICD-10-CM

## 2019-08-27 DIAGNOSIS — O99345 Other mental disorders complicating the puerperium: Secondary | ICD-10-CM

## 2019-08-27 DIAGNOSIS — Z23 Encounter for immunization: Secondary | ICD-10-CM

## 2019-08-27 DIAGNOSIS — Z30013 Encounter for initial prescription of injectable contraceptive: Secondary | ICD-10-CM

## 2019-08-27 LAB — POCT URINE PREGNANCY: Preg Test, Ur: NEGATIVE

## 2019-08-27 MED ORDER — MEDROXYPROGESTERONE ACETATE 150 MG/ML IM SUSP
150.0000 mg | Freq: Once | INTRAMUSCULAR | Status: AC
  Administered 2019-08-27: 150 mg via INTRAMUSCULAR

## 2019-08-27 NOTE — Progress Notes (Signed)
Subjective:    Carolyn Gomez is a 29 y.o. G42P1102 female who presents for a postpartum visit.  In person Spanish interpreter used for duration of visit.   She is 4 weeks postpartum following a spontaneous vaginal delivery. I have fully reviewed the prenatal and intrapartum course. The delivery was at 36 gestational weeks. Outcome: spontaneous vaginal delivery. Postpartum course has been unremarkable with exception of below. Baby's course has been normal. Baby is feeding by breast. Bleeding no bleeding. Bowel function is normal. Bladder function is normal. Patient is not sexually active. Contraception method is abstinence. Postpartum depression screening: Positive.  She is interested in the IUD, however has not filled out scholarship paperwork and does not have insurance.  Would like to do the Depo action in between.  She reports feeling down and overwhelmed.  Has not had this before and has not told her husband she feels this way.  Only family nearby is her mother who often works and reports she does not have any friends.  Interested in therapy but does not want to start any medications.  Denies any hallucinations, SI, or HI. Edinburgh score 16 (0 to #10).   UTD on Pap smear.     Office Visit from 08/27/2019 in [facility] Office Visit from 08/05/2019 in [facility] Routine Prenatal from 07/16/2019 in [facility]  Thoughts that you would be better off dead, or of hurting yourself in some way Not at all Not at all Not at all  PHQ-9 Total Score 8 0 9      Review of Systems Pertinent items are noted in HPI.   Vitals:   08/27/19 1033  BP: 102/62  Pulse: 68  SpO2: 98%  Weight: 188 lb (85.3 kg)    Objective:  General: Alert, NAD HEENT: NCAT Cardiac: RRR no m/g/r Lungs: Clear bilaterally, no increased WOB  Abdomen: soft, uterus nonpalpable, nondistended abdomen Msk: Moves all extremities spontaneously  Ext: Warm, dry, 2+  distal pulses Psych: Tearful at times, depressed mood and affect.   Assessment:   Unremarkable postpartum exam 4 weeks wks s/p SVD at [redacted] weeks gestation Depression screening positive Contraception counseling   Plan:  Contraception management: Received Depo injection today and given scholarship paperwork to complete for IUD placement in the future.  Recommended continued abstinence from sexual activity until at least 6 weeks postpartum.  Postpartum depression: Denies previous difficulty with depression/anxiety, no associated SI/HI.  Discussed establishing with therapy, discussing concerns with her husband, and attending support groups through [facility] postpartum program.  Provided therapy and support group contact information.  Aware of crisis number and when to call.  Covid vaccination: Received second dose today, tolerated well and monitored >15 minutes.    Follow up in: 2 weeks for above or as needed.   Allayne Stack, DO

## 2019-08-27 NOTE — Patient Instructions (Addendum)
Perinatal Education at [phone] to schedule with a post-partum support group if they offer this or go to [email or web address]   Outpatient Mental Health Providers (No Insurance required or Self Pay)  [facility]  [address] [place], [place] [phone] Crisis [phone]  [facility]) can see uninsured folks for outpatient therapy [email or web address] [address] [place], [place] [address] [phone]  [facility]    Walk-in Mon-Fri, 8am-3pm [email or web address] Scull [address] [place], [place]  [phone]   [address]  [place] [phone] [facility] for psych med management, there may be a wait- if [facility] is working with clients for OPT, they will coordinate with [facility] for psych  [facility]   Walk-in-Clinic: Monday- Friday 9:00 AM - 4:00 PM [address]   [place], [place] [phone]  [facility] [place] ([employer]) walk in M-F 8am-12pm and  1pm-3pm [place]- [address]     [phone]  [employer] -[address]  Phone: [phone]  [employer] (Mental Health and substance challenges) [address]   [place] [phone]    [email or web address]    [facility]  [facility] -[address], [place]     Phone:  [phone] [place]-  [address]  [phone]   [facility]  [address] [place]  [phone] [email or web address]   [facility] ( virtual or zoom therapy) [email or web address]  [address] [place]  [phone]    [facility] [phone]  grief counseling, dementia and caregiver support    [facility] Walk-in MWF 12:30 to 3:00     [address] [place] [address]  [phone]  [email or web address] call to schedule an appointment    Mental  Health [facility] Classes ,Support group, Peer support services, [address] [place], [place] [address] [phone]  [email or web address]           National Alliance on Mental Illness (NAMI) [place]- Wellness classes, Support groups        [address], [place], [place] [address] [phone]   [email or web address]   [facility]  (Psycho-social Rehabilitation clubhouse, Individual and group therapy) [address] [place], [place] [address]   336- [phone]  24- Hour Availability:  [facility] [phone] or [phone] * [facility] [employer] (Domestic Violence, Rape, etc. )4358619927 Vesta Mixer [phone] or [phone] * [facility] [phone] only) [phone] (after hours) *[facility] Mobile Crisis Unit [phone] *[place] National Suicide Hotline (947)347-3452 Len Childs)

## 2019-08-27 NOTE — Progress Notes (Signed)
Initial depo provera injection given LUOQ, site unremarkable.   Patient due back for next injection 11/12/2019-11/26/2019.  Patient advised to use protection for 1 week.

## 2019-08-29 ENCOUNTER — Encounter: Payer: Self-pay | Admitting: Family Medicine

## 2019-09-11 ENCOUNTER — Encounter: Payer: Self-pay | Admitting: Family Medicine

## 2019-09-11 ENCOUNTER — Ambulatory Visit (INDEPENDENT_AMBULATORY_CARE_PROVIDER_SITE_OTHER): Payer: Self-pay | Admitting: Family Medicine

## 2019-09-11 ENCOUNTER — Other Ambulatory Visit: Payer: Self-pay

## 2019-09-11 DIAGNOSIS — O99345 Other mental disorders complicating the puerperium: Secondary | ICD-10-CM

## 2019-09-11 DIAGNOSIS — J069 Acute upper respiratory infection, unspecified: Secondary | ICD-10-CM | POA: Insufficient documentation

## 2019-09-11 DIAGNOSIS — F53 Postpartum depression: Secondary | ICD-10-CM

## 2019-09-11 NOTE — Assessment & Plan Note (Signed)
Improving.  PHQ-9 score of 7 today without SI or HI.  Recommended proceeding with support group and establishing therapy when available to do so.  Provided supportive listening.  Follow-up in 1 month to check in or sooner if needed.

## 2019-09-11 NOTE — Progress Notes (Signed)
[facility] Telemedicine Visit  Patient consented to have virtual visit and was identified by name and date of birth. Method of visit: Telephone  Encounter participants: Patient: Carolyn Gomez - located in her car Provider: Allayne Gomez - located at [facility] Others (if applicable): In person Spanish interpreter (present in her car)   CHIEF COMPLAINT / HPI: Follow-up depression  [place] is a 29 year old G2P2 female presenting for follow-up.  She initially presented to the clinic for an office visit, however had acute onset rhinorrhea and dry cough since this morning.  Otherwise feeling at her baseline, afebrile without any difficulty breathing, nausea, vomiting, chest pain.  No one else sick at home.  Just recently completed Covid vaccine series.  Proceeded with virtual visit.  She was last seen on 8/24 for her postpartum visit.  At that time she had an [place] postpartum depression score of 16 without any SI or HI.  No previous history of depression.  Discussed establishing with therapy, confiding in family/husband, and meeting with a support group at that time.  Today, states she is doing much better.  Feels in better spirits and mood.  Reached out to the support group, waiting for callback.  Has not had time to call for therapist, however did discuss her feelings with her husband.  She states this was very helpful and he has been trying to help more to even out duties.  Denies any thoughts of SI or HI.  Getting into a better routine with an infant at home.    Office Visit from 09/11/2019 in [facility] Office Visit from 08/27/2019 in [facility] Office Visit from 08/05/2019 in [facility]  Thoughts that you would be better off dead, or of hurting yourself in some way Not at all Not at all Not at all  PHQ-9 Total Score 7 8 0      PERTINENT  PMH / PSH: Anemia  Exam:  LMP 11/11/2018 (Exact Date)    Respiratory: Able to speak in full sentences without concern  Assessment/Plan:  Postpartum depression Improving.  PHQ-9 score of 7 today without SI or HI.  Recommended proceeding with support group and establishing therapy when available to do so.  Provided supportive listening.  Follow-up in 1 month to check in or sooner if needed.  Viral URI Suspect URI via symptoms, no in-depth evaluation performed via telephone.  Recommended maintaining hydration, Tylenol PRN, and tea with honey/cough drops for cough.  Discussed considering proceeding with Covid testing if persistent, patient currently stays at home.  Fortunately has been vaccinated, just completed series 2 weeks ago without any known sick contacts.    Follow-up in 1 month or sooner if needed as above.  Time spent during visit with patient: 12 minutes  Carolyn Stack, DO

## 2019-09-11 NOTE — Assessment & Plan Note (Addendum)
Suspect URI via symptoms, no in-depth evaluation performed via telephone.  Recommended maintaining hydration, Tylenol PRN, and tea with honey/cough drops for cough.  Discussed considering proceeding with Covid testing if persistent, patient currently stays at home.  Fortunately has been vaccinated, just completed series 2 weeks ago without any known sick contacts.

## 2019-10-16 ENCOUNTER — Other Ambulatory Visit: Payer: Self-pay

## 2019-10-16 ENCOUNTER — Encounter: Payer: Self-pay | Admitting: Family Medicine

## 2019-10-16 ENCOUNTER — Ambulatory Visit (INDEPENDENT_AMBULATORY_CARE_PROVIDER_SITE_OTHER): Payer: Self-pay | Admitting: Family Medicine

## 2019-10-16 VITALS — BP 100/60 | HR 80 | Ht 61.0 in | Wt 191.6 lb

## 2019-10-16 DIAGNOSIS — Z30019 Encounter for initial prescription of contraceptives, unspecified: Secondary | ICD-10-CM

## 2019-10-16 DIAGNOSIS — Z309 Encounter for contraceptive management, unspecified: Secondary | ICD-10-CM | POA: Insufficient documentation

## 2019-10-16 DIAGNOSIS — O99345 Other mental disorders complicating the puerperium: Secondary | ICD-10-CM

## 2019-10-16 DIAGNOSIS — D500 Iron deficiency anemia secondary to blood loss (chronic): Secondary | ICD-10-CM

## 2019-10-16 DIAGNOSIS — F53 Postpartum depression: Secondary | ICD-10-CM

## 2019-10-16 NOTE — Patient Instructions (Signed)
Wonderful to see you, glad you are doing better!

## 2019-10-16 NOTE — Assessment & Plan Note (Signed)
Continued improvement and stable without SI/HI.  Encouraged her to reach out to the support group again to establish a further support system in the community.  Provided supportive listening.  Encouraged to touch base if she has any worsening of mood prior to next follow-up visit.  Aware of crisis hotline.

## 2019-10-16 NOTE — Progress Notes (Signed)
° ° °  SUBJECTIVE:   CHIEF COMPLAINT / HPI: Follow-up postpartum depression  [place] is a 29 year old female presenting discussed the following:  An in person interpreter was present for the duration of the visit.  Postpartum depression: Initial concern on 8/24 through her postpartum visit with a [place] postpartum score of 16 without SI/HI at that time.  Fortunately her mood has since improved, feeling almost back to herself. She reports things are going good, feeling sleepy with waking up with baby but "getting used to it." Has not been to support group yet because they haven't called back. Did not establish with therapy as she is no longer interested. Sharing responsibilities with husband. Taking some time to herself, likes to window shop.   Recently found her 43 yo son smoking cigarettes and has been at school, this has provided some stress.   Anemia: Anemia of pregnancy with iron deficiency anemia as well.  Hemoglobin 9.2 in 08/2019.  Has been taking ferrous sulfate since 08/2019.  Denies lightheadedness/dizziness, shortness of breath, chest pain, palpitations.  Contraception: Got her Depo injection on 8/26 and would like to get the IUD.  She does not remember receiving paperwork for the scholarship.    Office Visit from 10/16/2019 in [facility] Office Visit from 09/11/2019 in [facility] Office Visit from 08/27/2019 in [facility]  Thoughts that you would be better off dead, or of hurting yourself in some way Not at all Not at all Not at all  PHQ-9 Total Score 5 7 8       PERTINENT  PMH / PSH: Anemia  OBJECTIVE:   BP 100/60    Pulse 80    Ht 5\' 1"  (1.549 m)    Wt 191 lb 9.6 oz (86.9 kg)    LMP 11/11/2018 (LMP Unknown)    SpO2 99%    BMI 36.20 kg/m   General: Alert, NAD HEENT: NCAT, MMM Cardiac: RRR  Lungs: Clear bilaterally, no increased WOB.  Currently breast-feeding infant without difficulty. Msk: Moves all  extremities spontaneously  Ext: Warm, dry, 2+ distal pulses Psych: Mood and affect, smiling and engaging in conversation, good eye contact   ASSESSMENT/PLAN:   Postpartum depression Continued improvement and stable without SI/HI.  Encouraged her to reach out to the support group again to establish a further support system in the community.  Provided supportive listening.  Encouraged to touch base if she has any worsening of mood prior to next follow-up visit.  Aware of crisis hotline.  Anemia CBC today, asymptomatic.  Continue daily ferrous sulfate.  Contraception management Currently using Depo injections, last given on 8/26.  Provided with IUD scholarship paperwork to complete and bring back to our office.  Pending approval, will schedule for IUD placement as soon as possible.  Discussed if she is not able to get IUD prior to when she is due for Depo, encouraged staying consistent with Depo for contraception in the interim.    Follow-up for IUD placement when approved (or Depo), otherwise follow-up in 3 months/sooner if needed.  13/08/2018, DO [facility]

## 2019-10-16 NOTE — Assessment & Plan Note (Signed)
Currently using Depo injections, last given on 8/26.  Provided with IUD scholarship paperwork to complete and bring back to our office.  Pending approval, will schedule for IUD placement as soon as possible.  Discussed if she is not able to get IUD prior to when she is due for Depo, encouraged staying consistent with Depo for contraception in the interim.

## 2019-10-16 NOTE — Assessment & Plan Note (Signed)
CBC today, asymptomatic.  Continue daily ferrous sulfate.

## 2019-10-17 LAB — CBC
Hematocrit: 34.7 % (ref 34.0–46.6)
Hemoglobin: 10.6 g/dL — ABNORMAL LOW (ref 11.1–15.9)
MCH: 22.9 pg — ABNORMAL LOW (ref 26.6–33.0)
MCHC: 30.5 g/dL — ABNORMAL LOW (ref 31.5–35.7)
MCV: 75 fL — ABNORMAL LOW (ref 79–97)
Platelets: 547 10*3/uL — ABNORMAL HIGH (ref 150–450)
RBC: 4.63 x10E6/uL (ref 3.77–5.28)
RDW: 15.4 % (ref 11.7–15.4)
WBC: 9.5 10*3/uL (ref 3.4–10.8)

## 2020-01-04 DIAGNOSIS — D649 Anemia, unspecified: Secondary | ICD-10-CM

## 2020-01-04 HISTORY — DX: Anemia, unspecified: D64.9

## 2020-01-04 NOTE — L&D Delivery Note (Addendum)
OB/GYN Faculty Practice Delivery Note  Carolyn Gomez is a 30 y.o. [id number] s/p VD at [redacted]w[redacted]d. She was admitted for IOL for intrahepatic cholestasis of pregnancy.   ROM: 2h 69m with clear fluid GBS Status: Negative/-- (10/13 1003) Maximum Maternal Temperature: 102.5 directly after delivery  Labor Progress: Patient presented to L&D for IOL for ICP. Initial SVE: 3/50/-3. She then progressed to complete.   Delivery Date/Time: 10/20/20 @1229  Delivery: Called to room and patient was complete and pushing. Head delivered ROA. No nuchal cord present. Shoulder and body delivered in usual fashion. Infant with spontaneous cry, placed on mother's abdomen, dried and stimulated. Cord clamped x 2 after 1-minute delay, and cut by father of baby. Cord blood drawn. Placenta delivered spontaneously with gentle cord traction. Fundus firm with massage and Pitocin. Labia, perineum, vagina, and cervix inspected. Placenta was examined after and found to be intact with normal 3-vessel cord.   Placenta: Intact Complications: None Lacerations: None EBL: 300 mL Analgesia: Epidural  Postpartum Planning [ ]  message to sent to schedule follow-up  [ ]  vaccines UTD -Tylenol and motrin postpartum. Monitor fever. Suspect due to delivery process. Unlikely to be endometritis given onset timeline.  Infant: APGARs 9/9  pending weight measurement  , DO 10/20/2020, 1:05 PM PGY-1, [facility]

## 2020-03-02 ENCOUNTER — Inpatient Hospital Stay (HOSPITAL_COMMUNITY)
Admission: AD | Admit: 2020-03-02 | Discharge: 2020-03-02 | Disposition: A | Discharge status: Home / Self Care | Payer: Self-pay | Attending: Family Medicine | Admitting: Family Medicine

## 2020-03-02 ENCOUNTER — Other Ambulatory Visit: Payer: Self-pay

## 2020-03-02 ENCOUNTER — Inpatient Hospital Stay (HOSPITAL_COMMUNITY): Payer: Self-pay

## 2020-03-02 ENCOUNTER — Encounter (HOSPITAL_COMMUNITY): Payer: Self-pay | Admitting: Family Medicine

## 2020-03-02 DIAGNOSIS — O26899 Other specified pregnancy related conditions, unspecified trimester: Secondary | ICD-10-CM

## 2020-03-02 DIAGNOSIS — R109 Unspecified abdominal pain: Secondary | ICD-10-CM

## 2020-03-02 DIAGNOSIS — O26891 Other specified pregnancy related conditions, first trimester: Secondary | ICD-10-CM

## 2020-03-02 DIAGNOSIS — Z3A00 Weeks of gestation of pregnancy not specified: Secondary | ICD-10-CM

## 2020-03-02 DIAGNOSIS — O3680X0 Pregnancy with inconclusive fetal viability, not applicable or unspecified: Secondary | ICD-10-CM

## 2020-03-02 DIAGNOSIS — Z6790 Unspecified blood type, Rh positive: Secondary | ICD-10-CM

## 2020-03-02 LAB — URINALYSIS, ROUTINE W REFLEX MICROSCOPIC
Bilirubin Urine: NEGATIVE
Glucose, UA: NEGATIVE mg/dL
Ketones, ur: NEGATIVE mg/dL
Nitrite: NEGATIVE
Protein, ur: NEGATIVE mg/dL
Specific Gravity, Urine: 1.028 (ref 1.005–1.030)
pH: 5 (ref 5.0–8.0)

## 2020-03-02 LAB — WET PREP, GENITAL
Sperm: NONE SEEN
Trich, Wet Prep: NONE SEEN
Yeast Wet Prep HPF POC: NONE SEEN

## 2020-03-02 LAB — COMPREHENSIVE METABOLIC PANEL
ALT: 33 U/L (ref 0–44)
AST: 29 U/L (ref 15–41)
Albumin: 4 g/dL (ref 3.5–5.0)
Alkaline Phosphatase: 76 U/L (ref 38–126)
Anion gap: 8 (ref 5–15)
BUN: 7 mg/dL (ref 6–20)
CO2: 22 mmol/L (ref 22–32)
Calcium: 8.7 mg/dL — ABNORMAL LOW (ref 8.9–10.3)
Chloride: 107 mmol/L (ref 98–111)
Creatinine, Ser: 0.56 mg/dL (ref 0.44–1.00)
GFR, Estimated: >60 mL/min (ref >60)
Glucose, Bld: 96 mg/dL (ref 70–99)
Potassium: 3.9 mmol/L (ref 3.5–5.1)
Sodium: 137 mmol/L (ref 135–145)
Total Bilirubin: 0.8 mg/dL (ref 0.3–1.2)
Total Protein: 6.9 g/dL (ref 6.5–8.1)

## 2020-03-02 LAB — CBC
HCT: 35 % — ABNORMAL LOW (ref 36.0–46.0)
Hemoglobin: 10.8 g/dL — ABNORMAL LOW (ref 12.0–15.0)
MCH: 23.8 pg — ABNORMAL LOW (ref 26.0–34.0)
MCHC: 30.9 g/dL (ref 30.0–36.0)
MCV: 77.1 fL — ABNORMAL LOW (ref 80.0–100.0)
Platelets: 480 10*3/uL — ABNORMAL HIGH (ref 150–400)
RBC: 4.54 MIL/uL (ref 3.87–5.11)
RDW: 16.2 % — ABNORMAL HIGH (ref 11.5–15.5)
WBC: 7.3 10*3/uL (ref 4.0–10.5)
nRBC: 0 % (ref 0.0–0.2)

## 2020-03-02 LAB — POCT PREGNANCY, URINE: Preg Test, Ur: POSITIVE — AB

## 2020-03-02 LAB — HCG, QUANTITATIVE, PREGNANCY: hCG, Beta Chain, Quant, S: 481 m[IU]/mL — ABNORMAL HIGH (ref <5)

## 2020-03-02 NOTE — MAU Provider Note (Cosign Needed Addendum)
History     CSN: [id number]  Arrival date and time: 03/02/20 0935   Event Date/Time   First Provider Initiated Contact with Patient 03/02/20 1045      Chief Complaint  Patient presents with  . Abdominal Pain  . Pelvic Pain   Carolyn Gomez is a 30 y.o. G3P1001 at Unknown who presents to MAU for abdominal and pelvic pain that started about 2 days ago. Patient reports the pain comes and goes. Patient identifies the area between the umbilicus and the pelvis as the area of pain. Reports the pain is present currently, but is getting better and is currently almost gone. Patient reports she has been taking Tylenol for the pain, which she reports is working and rates pain currently as 2/10. Patient reports she has been using Depo since she delivered 07/2019, but did receive her Depo injection in January about 2 weeks late. Patient reports she did not get a pregnancy test done before she got her Depo injection in January.   Pt denies VB, LOF, ctx, decreased FM, vaginal discharge/odor/itching. Pt denies N/V, constipation, diarrhea, or urinary problems. Pt denies fever, chills, fatigue, sweating or changes in appetite. Pt denies SOB or chest pain. Pt denies dizziness, HA, light-headedness, weakness.  Problems this pregnancy include: pt has not yet been seen. Allergies? NKDA Current medications/supplements? none Prenatal care provider? Pt requests list of OB providers  Spanish interpreter used for entire visit.   OB History    Gravida  3   Para  1   Term  1   Preterm      AB      Living  1     SAB      IAB      Ectopic      Multiple      Live Births  1        Obstetric Comments  Per patient, patient had anemia in last pregnancy and required transfusion after delivery. She reports previously her anemia was 'severe' and required 1 unit pRBC postpartum.   Postpartum she reports she had some heavy bleeding but this resolved.         Past Medical History:   Diagnosis Date  . Medical history non-contributory     Past Surgical History:  Procedure Laterality Date  . NO PAST SURGERIES      History reviewed. No pertinent family history.  Social History   Tobacco Use  . Smoking status: Never Smoker  . Smokeless tobacco: Never Used  Vaping Use  . Vaping Use: Never used  Substance Use Topics  . Alcohol use: Never    Comment: occasionally drink a beer  . Drug use: Never    Allergies: No Known Allergies  No medications prior to admission.    Review of Systems  Constitutional: Negative for chills, diaphoresis, fatigue and fever.  Eyes: Negative for visual disturbance.  Respiratory: Negative for shortness of breath.   Cardiovascular: Negative for chest pain.  Gastrointestinal: Positive for abdominal pain. Negative for constipation, diarrhea, nausea and vomiting.  Genitourinary: Positive for pelvic pain. Negative for dysuria, flank pain, frequency, urgency, vaginal bleeding and vaginal discharge.  Neurological: Negative for dizziness, weakness, light-headedness and headaches.   Physical Exam   Blood pressure 122/81, pulse 78, temperature 98.3 F (36.8 C), temperature source Oral, resp. rate 20, height 5\' 2"  (1.575 m), weight 87.3 kg, last menstrual period 02/11/2020, SpO2 100 %, unknown if currently breastfeeding.  Patient Vitals for the past 24 hrs:  BP Temp Temp src Pulse Resp SpO2 Height Weight  03/02/20 1303 122/81 98.3 F (36.8 C) Oral 78 - - - -  03/02/20 1032 124/73 98.2 F (36.8 C) Oral 70 - - - -  03/02/20 1022 (!) 142/98 - - 73 - 100 % - -  03/02/20 1020 (!) 139/107 98.9 F (37.2 C) Oral 85 20 100 % - -  03/02/20 1008 - - - - - - 5\' 2"  (1.575 m) 87.3 kg  *pt upset after learning about pregnancy at time initial BPs were taken, normal BP after pt had time to absorb news  Physical Exam Constitutional:      General: She is not in acute distress.    Appearance: She is well-developed and well-nourished. She is not  diaphoretic.  HENT:     Head: Normocephalic and atraumatic.  Pulmonary:     Effort: Pulmonary effort is normal.  Abdominal:     General: There is no distension.     Palpations: Abdomen is soft. There is no mass.     Tenderness: There is no abdominal tenderness. There is no guarding or rebound.  Skin:    General: Skin is warm and dry.  Neurological:     Mental Status: She is alert and oriented to person, place, and time.  Psychiatric:        Mood and Affect: Mood and affect normal.        Behavior: Behavior normal.        Thought Content: Thought content normal.        Judgment: Judgment normal.    Results for orders placed or performed during the hospital encounter of 03/02/20 (from the past 24 hour(s))  Urinalysis, Routine w reflex microscopic Urine, Clean Catch     Status: Abnormal   Collection Time: 03/02/20 10:10 AM  Result Value Ref Range   Color, Urine YELLOW YELLOW   APPearance HAZY (A) CLEAR   Specific Gravity, Urine 1.028 1.005 - 1.030   pH 5.0 5.0 - 8.0   Glucose, UA NEGATIVE NEGATIVE mg/dL   Hgb urine dipstick SMALL (A) NEGATIVE   Bilirubin Urine NEGATIVE NEGATIVE   Ketones, ur NEGATIVE NEGATIVE mg/dL   Protein, ur NEGATIVE NEGATIVE mg/dL   Nitrite NEGATIVE NEGATIVE   Leukocytes,Ua MODERATE (A) NEGATIVE   RBC / HPF 0-5 0 - 5 RBC/hpf   WBC, UA 6-10 0 - 5 WBC/hpf   Bacteria, UA FEW (A) NONE SEEN   Squamous Epithelial / LPF 11-20 0 - 5   Mucus PRESENT   Pregnancy, urine POC     Status: Abnormal   Collection Time: 03/02/20 10:14 AM  Result Value Ref Range   Preg Test, Ur POSITIVE (A) NEGATIVE  Wet prep, genital     Status: Abnormal   Collection Time: 03/02/20 11:11 AM  Result Value Ref Range   Yeast Wet Prep HPF POC NONE SEEN NONE SEEN   Trich, Wet Prep NONE SEEN NONE SEEN   Clue Cells Wet Prep HPF POC PRESENT (A) NONE SEEN   WBC, Wet Prep HPF POC FEW (A) NONE SEEN   Sperm NONE SEEN   CBC     Status: Abnormal   Collection Time: 03/02/20 11:14 AM  Result  Value Ref Range   WBC 7.3 4.0 - 10.5 K/uL   RBC 4.54 3.87 - 5.11 MIL/uL   Hemoglobin 10.8 (L) 12.0 - 15.0 g/dL   HCT 03/04/20 (L) 38.1 - 84.0 %   MCV 77.1 (L) 80.0 - 100.0 fL  MCH 23.8 (L) 26.0 - 34.0 pg   MCHC 30.9 30.0 - 36.0 g/dL   RDW 78.216.2 (H) 95.611.5 - 21.315.5 %   Platelets 480 (H) 150 - 400 K/uL   nRBC 0.0 0.0 - 0.2 %  Comprehensive metabolic panel     Status: Abnormal   Collection Time: 03/02/20 11:14 AM  Result Value Ref Range   Sodium 137 135 - 145 mmol/L   Potassium 3.9 3.5 - 5.1 mmol/L   Chloride 107 98 - 111 mmol/L   CO2 22 22 - 32 mmol/L   Glucose, Bld 96 70 - 99 mg/dL   BUN 7 6 - 20 mg/dL   Creatinine, Ser 0.860.56 0.44 - 1.00 mg/dL   Calcium 8.7 (L) 8.9 - 10.3 mg/dL   Total Protein 6.9 6.5 - 8.1 g/dL   Albumin 4.0 3.5 - 5.0 g/dL   AST 29 15 - 41 U/L   ALT 33 0 - 44 U/L   Alkaline Phosphatase 76 38 - 126 U/L   Total Bilirubin 0.8 0.3 - 1.2 mg/dL   GFR, Estimated >57>60 >84>60 mL/min   Anion gap 8 5 - 15  hCG, quantitative, pregnancy     Status: Abnormal   Collection Time: 03/02/20 11:14 AM  Result Value Ref Range   hCG, Beta Chain, Quant, S 481 (H) <5 mIU/mL   US OB LESS THAN 14 WEEKS WITH OB TRANSVAGINAL  Result Date: 03/02/2020 CLINICAL DATA:  Pelvic pain with positive pregnancy test EXAM: OBSTETRIC <14 WK US AND TRANSVAGINAL OB US TECHNIQUE: Both transabdominal and transvaginal ultrasound examinations were performed for complete evaluation of the gestation as well as the maternal uterus, adnexal regions, and pelvic cul-de-sac. Transvaginal technique was performed to assess early pregnancy. COMPARISON:  None. FINDINGS: Intrauterine gestational sac: Absent Maternal uterus/adnexae: Uterus and ovaries are well visualized and within normal limits. Endometrium measures 16 mm. Minimal free fluid is noted. IMPRESSION: No evidence of intrauterine or extrauterine gestational sac. Correlate with serial beta HCG levels. Follow-up ultrasound can be performed as clinically indicated.  Electronically Signed   By: Alcide CleverMark  Lukens M.D.   On: 03/02/2020 12:47    MAU Course  Procedures  MDM -r/o ectopic -UA: hazy/sm hgb/mod leuks/few bacteria, sending urine for culture -CBC: platelets 480 -CMP: Ca 8.7 -US: PUL -hCG: 481 -ABO: O Positive -WetPrep: +ClueCells (isolated finding not requiring treatment) -GC/CT collected Discussed with client the diagnosis of pregnancy of unknown anatomic location.  Three possibilities of outcome are: a healthy pregnancy that is too early to see a yolk sac to confirm the pregnancy is in the uterus, a pregnancy that is not healthy and has not developed and will not develop, and an ectopic pregnancy that is in the abdomen that cannot be identified at this time.  And ectopic pregnancy can be a life threatening situation as a pregnancy needs to be in the uterus which is a muscle and can stretch to accommodate the growth of a pregnancy.  Other structures in the pelvis and abdomen as not muscular and do not stretch with the growth of a pregnancy.  Worst case scenario is that a structure ruptures with a growing pregnancy not in the uterus and and internal hemorrhage can be a life threatening situation.  We need to follow the progression of this pregnancy carefully.  We need to check another serum pregnancy hormone level to determine if the levels are rising appropriately  and to determine the next steps that are needed for you. Patient's questions were answered. -pt discharged  to home in stable condition  Orders Placed This Encounter  Procedures  . Wet prep, genital    Standing Status:   Standing    Number of Occurrences:   1  . Culture, OB Urine    Standing Status:   Standing    Number of Occurrences:   1  . US OB LESS THAN 14 WEEKS WITH OB TRANSVAGINAL    Standing Status:   Standing    Number of Occurrences:   1    Order Specific Question:   Symptom/Reason for Exam    Answer:   Abdominal pain affecting pregnancy [[id number]]  . Urinalysis, Routine w  reflex microscopic Urine, Clean Catch    Standing Status:   Standing    Number of Occurrences:   1  . CBC    Standing Status:   Standing    Number of Occurrences:   1  . Comprehensive metabolic panel    Standing Status:   Standing    Number of Occurrences:   1  . hCG, quantitative, pregnancy    Standing Status:   Standing    Number of Occurrences:   1  . Pregnancy, urine POC    Standing Status:   Standing    Number of Occurrences:   1  . Discharge patient    Order Specific Question:   Discharge disposition    Answer:   01-Home or Self Care [1]    Order Specific Question:   Discharge patient date    Answer:   03/02/2020   No orders of the defined types were placed in this encounter.   Assessment and Plan   1. Pregnancy of unknown anatomic location   2. Abdominal pain affecting pregnancy   3. Blood type, Rh positive     Allergies as of 03/02/2020   No Known Allergies     Medication List    TAKE these medications   acetaminophen 325 MG tablet Commonly known as: Tylenol Take 2 tablets (650 mg total) by mouth every 4 (four) hours as needed (for pain scale < 4).   ferrous sulfate 324 (65 Fe) MG Tbec Take 1 tablet (325 mg total) by mouth in the morning and at bedtime. Take 1 tablet daily       -will call with culture results, if positive -safe meds in pregnancy list given -list of OB providers given -discussed ectopic vs. Normal IUP vs. miscarriage -strict ectopic precautions given -return MAU precautions -f/u on 03/04/2020 at [facility] for repeat hCG at 9AM -pt discharged to home in stable condition  Joni Reining E Nugent 03/02/2020, 2:03 PM

## 2020-03-02 NOTE — MAU Note (Signed)
Pt is a G3P1 c/o abdominal pain and cramping, unsure of pregnancy status, last depo injection in January, LMP in February., positive UPT here today.  No bleeding or fever.

## 2020-03-02 NOTE — MAU Note (Signed)
Presents with c/o abdominal and pelvic pain that began 2 days ago.  Denies VB.  LMP 02/11/2020, reports menstrual cycles are irregular.  Reports had Depo shot in January.

## 2020-03-02 NOTE — Discharge Instructions (Signed)
Embarazo ectpico Ectopic Pregnancy  Un embarazo ectpico ocurre cuando un vulo fertilizado se fija (implanta) fuera del tero. En un embarazo normal, un vulo fertilizado se implanta en el tero. Un embarazo ectpico no puede convertirse en un beb sano. En su mayora, los embarazos ectpicos se producen en una de las trompas de [place], que es por donde el vulo se desplaza desde un ovario hasta llegar al tero. Esto se denomina embarazo tubrico. Tambin puede producirse un embarazo ectpico en un ovario, en el cuello uterino o en el abdomen. Cuando un vulo fecundado se implanta en el tejido que est fuera del tero y comienza a Designer, industrial/product, puede hacer que el tejido se desgarre o Leisure centre manager. Esto se conoce como ruptura de un Psychiatrist ectpico. El desgarro o estallido causa hemorragia interna. Esto puede producir un dolor intenso en el abdomen. Un embarazo ectpico constituye una emergencia mdica y puede ser potencialmente mortal. Cules son las causas? La causa ms frecuente de esta afeccin es un dao en una de las trompas de [place]. Una trompa de [place] puede estar obstruida o Dietitian, y esto no deja que el vulo fertilizado llegue al tero. A veces, se desconoce la causa de esta afeccin. Qu incrementa el riesgo? Los siguientes factores pueden hacer que sea ms propenso a desarrollar esta afeccin:  Haber realizado tratamientos para la infertilidad antes.  Haber tenido un embarazo ectpico antes.  Haberse sometido a [place] para ligarse las trompas de [place].  Weyman Croon [place] un dispositivo intrauterino como mtodo anticonceptivo.  Tomar pldoras anticonceptivas antes de los 16 aos de edad. Otros factores de riesgo son los siguientes:  Art therapist.  Consumo de alcohol.  Antecedentes de exposicin a dietilestilbestrol (DES). El DES es un medicamento que se [place] [employer] 1972 y afect a los bebs cuyas madres lo tomaron. Cules son los signos o sntomas? Los sntomas  frecuentes de esta afeccin incluyen los siguientes:  Ausencia del perodo menstrual.  Nuseas o cansancio.  Sensibilidad en las mamas.  Otros sntomas normales del Psychiatrist. Otros sntomas pueden incluir:  Dolor al [employer].  Sangrado o manchado vaginal.  Clicos o dolor en la parte inferior del abdomen.  Latidos cardacos rpidos, presin arterial baja y sudoracin.  Dolor o aumento de la presin al hacer deposiciones. Algunos de los sntomas de ruptura de un Psychiatrist ectpico y hemorragias internas son los siguientes:  Dolor intenso y repentino en el abdomen.  Mareos, debilidad, sensacin de desvanecimiento o desmayos.  Dolor en la zona del hombro o el cuello. Cmo se diagnostica? Esta afeccin se diagnostica mediante:  Un anlisis de sangre para Engineer, manufacturing la hormona del embarazo.  Un examen plvico para encontrar las reas dolorosas o un bulto en el abdomen.  Una ecografa. Se introduce una sonda en la vagina para ver si hay un embarazo en el tero o fuera del tero.  Extraccin de [place] de tejido del tero.  Ciruga para observar de cerca las trompas de [place] a travs de una incisin hecha en el abdomen. Cmo se trata? Generalmente, esta afeccin se trata con medicamentos o con [place]. En ocasiones, los embarazos ectpicos pueden [employer] por s solos, bajo estrecha vigilancia por parte del mdico. Medicamentos Le pueden administrar una inyeccin de metotrexato para hacer que el tejido fetal se absorba. Este medicamento puede administrarse si:  El diagnstico se realiza de forma temprana, sin signos de Economist.  La trompa de [place] no est desgarrada ni estallada. Deber hacerse anlisis de sangre para asegurarse de  que el medicamento est surtiendo [employer]. El tejido fetal puede tardar entre 4 y 6semanas en absorberse. Ciruga Puede realizarse Neomia Dear ciruga para:  Extirpar el tejido fetal.  Detener una hemorragia interna.  Extirpar  Neomia Dear parte o la totalidad de la trompa de [place].  Extirpar Cleon Dew. Esto es poco frecuente. Despus de la [place], es posible que deba realizarse anlisis de sangre para asegurarse de que la ciruga fue eficaz. Siga estas instrucciones en su casa: Medicamentos  Use los medicamentos de venta libre y los recetados solamente como se lo haya indicado el mdico.  Pregntele al mdico si el medicamento recetado: ? Hace necesario que evite conducir o Child psychotherapist. ? Puede causarle estreimiento. Es posible que tenga que tomar estas medidas para prevenir o tratar el estreimiento:  Product manager suficiente lquido como para Pharmacologist la orina de color amarillo plido.  Usar medicamentos recetados o de Sales promotion account executive.  Consumir alimentos ricos en fibra, como frijoles, cereales integrales, y frutas y verduras frescas.  Limitar el consumo de alimentos ricos en grasa y azcares procesados, como los alimentos fritos o dulces. Indicaciones generales  Haga reposo o limite las actividades si se lo indica el mdico.  No tenga relaciones sexuales, no se introduzca nada en la vagina, como tampones, ni se haga duchas vaginales durante 6 semanas o hasta que el mdico le diga que es seguro [place].  No levante ningn objeto que pese ms de 10 libras (4.5kg) o que supere el lmite de peso que le hayan indicado, hasta que el mdico le diga que puede [place].  Retome sus actividades normales segn lo indicado por el mdico. Pregntele al mdico qu actividades son seguras para usted.  Cumpla con todas las visitas de seguimiento. Esto es importante. Comunquese con un mdico si:  Tiene fiebre o escalofros.  Tiene nuseas y vmitos. Solicite ayuda de inmediato si:  El dolor empeora o no se alivia con los medicamentos.  Se siente mareada o dbil.  Se siente mareada o se desmaya.  Siente un dolor intenso y repentino en el abdomen.  Siente un dolor repentino en la zona del cuello o el  hombro. Resumen  Un embarazo ectpico ocurre cuando un vulo fertilizado se implanta fuera del tero. En su mayora, los embarazos ectpicos se producen en una de las trompas de [place].  Un embarazo ectpico constituye una emergencia mdica y puede ser potencialmente mortal.  La causa ms frecuente de esta afeccin es un dao en una de las trompas de [place].  Generalmente, esta afeccin se trata con medicamentos o con [place]. Algunos embarazos ectpicos se resuelven por s solos, bajo estrecha vigilancia por parte del mdico. Esta informacin no tiene Theme park manager el consejo del mdico. Asegrese de hacerle al mdico cualquier pregunta que tenga. Document Revised: 06/20/2019 Document Reviewed: 06/20/2019 Elsevier Patient Education  2021 Elsevier Inc.        Ruptura de un Psychiatrist ectpico Ruptured Ectopic Pregnancy  Un embarazo ectpico ocurre cuando el vulo fertilizado se fija (implanta) fuera del tero, generalmente, en una de las trompas de [place]. La trompa es lugar por el que se desplaza el vulo desde un ovario para llegar al tero. Un embarazo ectpico no puede convertirse en un beb sano. Cuando un vulo fecundado se implanta en el tejido que est fuera del tero y comienza a Designer, industrial/product, puede hacer que el tejido se desgarre o Leisure centre manager. Esto se conoce como ruptura de un Psychiatrist ectpico. El desgarro o estallido causa hemorragia interna. Esto puede producir un  dolor intenso en el abdomen. La ruptura de un embarazo ectpico puede afectar la capacidad para tener hijos (fertilidad), segn el dao que cause en los rganos genitales. La ruptura de un embarazo ectpico es una emergencia mdica. Si no se trata de inmediato, puede provocar prdida de sangre o choque, y eso puede ser potencialmente mortal. Cules son las causas? La ruptura de embarazo ectpico se produce porque est creciendo en un lugar que no est destinado a expandirse y Dealer de un  embarazo. Qu incrementa el riesgo? Es ms probable que sufra la ruptura de un Psychiatrist ectpico si:  Tiene un Multimedia programmer, pero no tiene ningn sntoma y Firefighter no se descubre a tiempo como para tratarlo antes de que se rompa.  Recibe tratamiento no quirrgico para un Multimedia programmer.  Decide no recibir ningn tratamiento para un embarazo ectpico. Cules son los signos o sntomas? Algunos de los sntomas de ruptura de un Psychiatrist ectpico y hemorragias internas son los siguientes:  Dolor intenso y repentino en el abdomen.  Sentirse mareada, dbil o que va a desvanecerse.  Desmayos.  Dolor en la zona del hombro o el cuello. Cmo se diagnostica? Esta afeccin se diagnostica en funcin de los antecedentes mdicos, los sntomas, un examen fsico y [place]. Estos estudios pueden incluir una ecografa y [place] de [place]. Cmo se trata? Esta afeccin se trata con la administracin de lquidos intravenosos (i.v.) y [place] de urgencia para extraer el embarazo ectpico y reparar la zona donde ocurri la ruptura. Si se perdi [employer], puede ser [employer] administracin de sangre donada (transfusin de [place]). Quizs le coloquen una inyeccin de inmunoglobulina Rho(D) si usted es Rh negativo y el padre del beb es Rh positivo, o si no conoce el tipo de Rh del padre. Esta inyeccin ayuda a prevenir problemas con el factor Rh en futuros embarazos. Puede recibir [employer]. Resumen  Un embarazo ectpico ocurre cuando el vulo fertilizado se fija (implanta) fuera del tero, generalmente, en una de las trompas de [place]. Cuando un vulo fecundado se implanta en el tejido que est fuera del tero y comienza a Designer, industrial/product, puede hacer que el tejido se desgarre o Leisure centre manager. Esto se conoce como ruptura de un embarazo ectpico.  La ruptura de un embarazo ectpico es una emergencia mdica. Si no se trata de inmediato, puede provocar prdida de sangre o choque, y eso puede  ser potencialmente mortal.  Esta afeccin se trata con la administracin de lquidos intravenosos (i.v.) y Neomia Dear ciruga de urgencia para extraer el Psychiatrist ectpico y reparar la zona donde ocurri la ruptura. Si se perdi [employer], puede ser Washington Mutual administracin de [place]. Esta informacin no tiene Theme park manager el consejo del mdico. Asegrese de hacerle al mdico cualquier pregunta que tenga. Document Revised: 06/20/2019 Document Reviewed: 06/20/2019 Elsevier Patient Education  2021 [employer].       Prenatal Care Providers           [facility] - accepts patients without insurance  Phone: [phone]  [facility] [employer] [facility]   Phone: [phone]  [facility]       Phone: [phone]            [facility] @ [place]     Phone: [phone]          [facility] for [id number] @ [employer]   Phone: [phone]  [facility] @ [facility] - accepts patients without insurance  Phone: [phone]  [facility] [employer] [facility] Phone: [phone]     [facility] - accepts patients without insurance Phone: [phone]  [facility]  Phone: [phone]  [facility] Phone: [phone]  [facility] Phone: [phone]  [facility] Phone: [phone]  [facility] Phone: [phone]  [facility]  Phone: [phone]        Las medicinas seguras para tomar durante el embarazo  Safe Medications in Pregnancy  Acn:  Benzoyl Peroxide (Perxido de benzolo)  Salicylic Acid (cido saliclico)  Dolor de espalda/Dolor de cabeza:  Tylenol: 2 pastillas de concentracin regular cada 4 horas O 2 pastillas de concentracin fuerte cada 6 horas  Resfriados/Tos/Alergias:  Benadryl (sin alcohol) 25 mg cada 6 horas segn  lo necesite Breath Right strips (Tiras para respirar correctamente)  Claritin  Cepacol (pastillas de chupar para la garganta)  Chloraseptic (aerosol para la garganta)  Cold-Eeze- hasta tres veces por da  Cough drops (pastillas de chupar para la tos, sin alcohol)  Flonase (con receta mdica solamente)  Guaifenesin  Mucinex  Robitussin DM (simple solamente, sin alcohol)  Saline nasal spray/drops (Aerosol nasal salino/gotas) Sudafed (pseudoephedrine) y  Actifed * utilizar slo despus de 12 semanas de gestacin y si no tiene la presin arterial alta.  Tylenol Vicks  VapoRub  Zinc lozenges (pastillas para la garganta)  Zyrtec  Estreimiento:  Colace  Ducolax (supositorios)  Fleet enema (lavado intestinal rectal)  Glycerin (supositorios)  Metamucil  Milk of magnesia (leche de magnesia)  Miralax  Senokot  Smooth Move (t)  Diarrea:  Kaopectate Imodium A-D  *NO tome Pepto-Bismol  Hemorroides:  Anusol  Anusol HC  Preparation H  Tucks  Indigestin:  Tums  Maalox  Mylanta  Zantac  Pepcid  Insomnia:  Benadryl (sin alcohol) 25mg  cada 6 horas segn lo necesite  Tylenol PM  Unisom, no Gelcaps  [place]ambres en las piernas:  Tums  MagGel Nuseas/Vmitos:  Bonine  Dramamine  Emetrol  Ginger (extracto)  Sea-Bands  Meclizine  Medicina para las nuseas que puede tomar durante el embarazo: Unisom (doxylamine succinate, pastillas de 25 mg) Tome una pastilla al da al [place]. Si los sntomas no estn adecuadamente controlados, la dosis puede aumentarse hasta una dosis mxima recomendada de [place] al da (1/2 pastilla por la [place], 1/2 pastilla a media tarde y [place] pastilla al [place]). Pastillas de Vitamina B6 de 100mg . Tome [place] veces al da (hasta 200 mg por da).  Erupciones en la piel:  Productos de Aveeno  Benadryl cream (crema o una dosis de 25mg  cada 6 horas segn lo necesite)  Calamine Lotion (locin)  1% cortisone cream (crema de cortisona de 1%)   nfeccin vaginal por hongos (candidiasis):  Gyne-lotrimin 7  Monistat 7   **Si est tomando varias medicinas, por favor revise las etiquetas para los mismos ingredientes [place]. **Tome la medicina segn lo indicado en la etiqueta. **No tome ms de 400 mg de Tylenol en 24 horas. **No tome medicinas que contengan aspirina o ibuprofeno.

## 2020-03-03 LAB — GC/CHLAMYDIA PROBE AMP (~~LOC~~) NOT AT ARMC
Chlamydia: NEGATIVE
Comment: NEGATIVE
Comment: NORMAL
Neisseria Gonorrhea: NEGATIVE

## 2020-03-03 LAB — CULTURE, OB URINE: Culture: NO GROWTH

## 2020-03-04 ENCOUNTER — Ambulatory Visit (INDEPENDENT_AMBULATORY_CARE_PROVIDER_SITE_OTHER): Payer: Self-pay

## 2020-03-04 ENCOUNTER — Telehealth: Payer: Self-pay

## 2020-03-04 ENCOUNTER — Other Ambulatory Visit: Payer: Self-pay

## 2020-03-04 VITALS — BP 130/77 | HR 82 | Wt 193.4 lb

## 2020-03-04 DIAGNOSIS — O3680X0 Pregnancy with inconclusive fetal viability, not applicable or unspecified: Secondary | ICD-10-CM

## 2020-03-04 LAB — BETA HCG QUANT (REF LAB): hCG Quant: 751 m[IU]/mL

## 2020-03-04 NOTE — Telephone Encounter (Signed)
Called pt with interpreter Eda to follow up on missed stat Beta hCG appt this AM. Pt states she thought appt was tomorrow. Pt is agreeable to come to the office now; states she will be here in 30 minutes. Front office notified.

## 2020-03-04 NOTE — Progress Notes (Signed)
Here today for stat beta hCG following visit to MAU on 03/02/20 for pelvic pain during early pregnancy. Pt denies any pain or bleeding today. Beta hCG drawn. Explained to pt I will call with results and plan of care, best phone number verified. Ectopic precautions reviewed.   Beta hCG result today is 751, which has increased from 481 on 03/02/20. Reviewed with Vergie Living, MD who recommends pt have [place] in 14 days. [place] scheduled for 03/18/20 @ 1000, pt to arrive at 0945. Called pt with interpreter Eda; result and plan of care given. Explained ectopic pregnancy has not been ruled out, so pt should return to MAU for any severe pain or vaginal bleeding like a period until her [place].   Fleet Contras RN 03/04/20

## 2020-03-05 ENCOUNTER — Telehealth: Payer: Self-pay | Admitting: Family Medicine

## 2020-03-05 NOTE — Telephone Encounter (Signed)
Please call patient and let her know that her hcg level almost doubled but not quite. This may be due to differences between the hospital lab and [facility], etc., but since it hasn't yet doubled we can't confirm this is a normal pregnancy.  She should return tomorrow for a stat beta. Review ectopic precautions. May need additional level over the weekend pending that result.

## 2020-03-09 NOTE — Progress Notes (Signed)
Patient was assessed and managed by nursing staff during this encounter. I have reviewed the chart and agree with the documentation and plan. I have also made any necessary editorial changes.  Beloit Bing, MD 03/09/2020 8:30 AM

## 2020-03-18 ENCOUNTER — Encounter: Payer: Self-pay | Admitting: *Deleted

## 2020-03-18 ENCOUNTER — Ambulatory Visit (INDEPENDENT_AMBULATORY_CARE_PROVIDER_SITE_OTHER): Payer: Self-pay | Admitting: *Deleted

## 2020-03-18 ENCOUNTER — Other Ambulatory Visit: Payer: Self-pay

## 2020-03-18 ENCOUNTER — Ambulatory Visit
Admission: RE | Admit: 2020-03-18 | Discharge: 2020-03-18 | Disposition: A | Discharge status: Home / Self Care | Payer: Self-pay | Source: Ambulatory Visit | Attending: Obstetrics and Gynecology | Admitting: Obstetrics and Gynecology

## 2020-03-18 ENCOUNTER — Encounter: Payer: Self-pay | Admitting: Family Medicine

## 2020-03-18 VITALS — BP 127/86 | HR 75 | Ht 62.0 in

## 2020-03-18 DIAGNOSIS — O3680X0 Pregnancy with inconclusive fetal viability, not applicable or unspecified: Secondary | ICD-10-CM | POA: Insufficient documentation

## 2020-03-18 DIAGNOSIS — Z712 Person consulting for explanation of examination or test findings: Secondary | ICD-10-CM

## 2020-03-18 NOTE — Progress Notes (Signed)
Interpreter Carolyn Gomez present for encounter. Pt here for [place] results which were reviewed by Dr. Crissie Reese. She reports LMP 02/11/20 however her periods have been irregular for several months. Pt was informed of viable IUP @ 6w 2d and EDD 11/09/20. Pt was also informed that she may have spotting due to a tiny blood clot in the uterus and that there is a small chance of miscarriage. She should go to MAU if she experiences heavy vaginal bleeding like a period or abdominal pain. Pt should begin taking prenatal vitamins and may schedule prenatal care @ [facility] where she has established medical care. Pt asked if she could continue breast feeding and was told that she can. She voiced understanding of all information and instructions given.

## 2020-03-18 NOTE — Progress Notes (Signed)
Chart reviewed for nurse visit. Agree with plan of care.   Jaramiah Bossard M, MD 03/18/20 12:03 PM 

## 2020-05-08 ENCOUNTER — Other Ambulatory Visit: Payer: Self-pay

## 2020-05-08 ENCOUNTER — Inpatient Hospital Stay (HOSPITAL_COMMUNITY)
Admission: AD | Admit: 2020-05-08 | Discharge: 2020-05-08 | Disposition: A | Discharge status: Home / Self Care | Payer: Self-pay | Attending: Obstetrics & Gynecology | Admitting: Obstetrics & Gynecology

## 2020-05-08 ENCOUNTER — Encounter (HOSPITAL_COMMUNITY): Payer: Self-pay | Admitting: Obstetrics & Gynecology

## 2020-05-08 DIAGNOSIS — O26891 Other specified pregnancy related conditions, first trimester: Secondary | ICD-10-CM | POA: Insufficient documentation

## 2020-05-08 DIAGNOSIS — O26899 Other specified pregnancy related conditions, unspecified trimester: Secondary | ICD-10-CM

## 2020-05-08 DIAGNOSIS — R102 Pelvic and perineal pain: Secondary | ICD-10-CM | POA: Insufficient documentation

## 2020-05-08 DIAGNOSIS — Z3A13 13 weeks gestation of pregnancy: Secondary | ICD-10-CM | POA: Insufficient documentation

## 2020-05-08 LAB — URINALYSIS, ROUTINE W REFLEX MICROSCOPIC
Bilirubin Urine: NEGATIVE
Glucose, UA: NEGATIVE mg/dL
Ketones, ur: NEGATIVE mg/dL
Leukocytes,Ua: NEGATIVE
Nitrite: NEGATIVE
Protein, ur: NEGATIVE mg/dL
Specific Gravity, Urine: 1.023 (ref 1.005–1.030)
pH: 5 (ref 5.0–8.0)

## 2020-05-08 NOTE — MAU Note (Signed)
Presents with c/o left lateral abdominal pain that began yesterday.  Reports pain is intermittent and sharp.  Denies or LOF.

## 2020-05-08 NOTE — MAU Provider Note (Signed)
History     CSN: [id number]  Arrival date and time: 05/08/20 7829   Event Date/Time   First Provider Initiated Contact with Patient 05/08/20 [phone]     * Spanish interpreter Orlan Leavens present for this encounter*  Chief Complaint  Patient presents with  . Abdominal Pain   HPI [place] Mahika Vanvoorhis is a 30 y.o. G2P1001 at [redacted]w[redacted]d who presents with abdominal pain. Symptoms started 2 days ago. Reports intermittent sharp pain in left abdomen that occurs when she sneezes or moves too fast. Rates pain 4/10 when it occurs. Hasn't treated symptoms. Vomited once yesterday but states that has been ongoing with the pregnancy. No fever, dysuria, vaginal bleeding, or vaginal discharge. Goes to [facility].   OB History    Gravida  2   Para  1   Term  1   Preterm      AB      Living  1     SAB      IAB      Ectopic      Multiple      Live Births  1        Obstetric Comments  Per patient, patient had anemia in last pregnancy and required transfusion after delivery. She reports previously her anemia was 'severe' and required 1 unit pRBC postpartum.   Postpartum she reports she had some heavy bleeding but this resolved.         Past Medical History:  Diagnosis Date  . Medical history non-contributory     Past Surgical History:  Procedure Laterality Date  . NO PAST SURGERIES      History reviewed. No pertinent family history.  Social History   Tobacco Use  . Smoking status: Never Smoker  . Smokeless tobacco: Never Used  Vaping Use  . Vaping Use: Never used  Substance Use Topics  . Alcohol use: Never    Comment: occasionally drink a beer  . Drug use: Never    Allergies: No Known Allergies  No medications prior to admission.    Review of Systems  Constitutional: Negative.   Gastrointestinal: Positive for abdominal pain and vomiting. Negative for constipation, diarrhea and nausea.  Genitourinary: Negative.    Physical Exam   Blood pressure  115/67, pulse 77, temperature 98 F (36.7 C), temperature source Oral, resp. rate 18, height 5\' 1"  (1.549 m), weight 85 kg, last menstrual period 02/11/2020, SpO2 100 %, currently breastfeeding.  Physical Exam Vitals and nursing note reviewed. Exam conducted with a chaperone present.  Constitutional:      General: She is not in acute distress.    Appearance: She is well-developed. She is obese.  HENT:     Head: Normocephalic and atraumatic.  Eyes:     General: No scleral icterus. Pulmonary:     Effort: Pulmonary effort is normal. No respiratory distress.  Abdominal:     Palpations: Abdomen is soft.     Tenderness: There is no abdominal tenderness.  Genitourinary:    Comments: Cervix closed/thick Skin:    General: Skin is warm and dry.  Neurological:     Mental Status: She is alert.  Psychiatric:        Mood and Affect: Mood normal.        Behavior: Behavior normal.     MAU Course  Procedures Results for orders placed or performed during the hospital encounter of 05/08/20 (from the past 24 hour(s))  Urinalysis, Routine w reflex microscopic Urine, Clean Catch  Status: Abnormal   Collection Time: 05/08/20  8:57 AM  Result Value Ref Range   Color, Urine YELLOW YELLOW   APPearance HAZY (A) CLEAR   Specific Gravity, Urine 1.023 1.005 - 1.030   pH 5.0 5.0 - 8.0   Glucose, UA NEGATIVE NEGATIVE mg/dL   Hgb urine dipstick MODERATE (A) NEGATIVE   Bilirubin Urine NEGATIVE NEGATIVE   Ketones, ur NEGATIVE NEGATIVE mg/dL   Protein, ur NEGATIVE NEGATIVE mg/dL   Nitrite NEGATIVE NEGATIVE   Leukocytes,Ua NEGATIVE NEGATIVE   RBC / HPF 6-10 0 - 5 RBC/hpf   WBC, UA 0-5 0 - 5 WBC/hpf   Bacteria, UA RARE (A) NONE SEEN   Squamous Epithelial / LPF 0-5 0 - 5   Mucus PRESENT     MDM  FHT present via doppler Cervix closed & no bleeding  Description of pain consistent with ligament pain.   U/a negative Assessment and Plan   1. Pain of round ligament affecting pregnancy, antepartum    2. [redacted] weeks gestation of pregnancy   -keep appt with [facility] -reviewed reasons to return to MAU    Judeth Horn 05/08/2020, 4:40 PM

## 2020-05-08 NOTE — Discharge Instructions (Signed)
Dolor del ligamento redondo  Round Ligament Pain    El ligamento redondo es un cordón de músculo y tejido que sirve de sostén para el útero. Puede volverse una fuente de dolor durante el embarazo si se distiende o se torsiona a medida que el bebé crece. Generalmente, el dolor empieza en el segundo trimestre (semanas 13 a 28) de embarazo, y puede aparecer y desaparecer hasta el momento del parto. No se trata de un problema grave y no es perjudicial para el bebé.  El dolor del ligamento redondo suele ser agudo y punzante, y durar poco tiempo, pero también puede ser sordo, persistente y continuo. Se lo percibe en la región inferior del abdomen o en la ingle. A menudo comienza en la zona más profunda de la ingle y se extiende hacia región externa de la cadera. El dolor puede producirse cuando usted:  · Cambia súbitamente de posición, como pasar rápidamente de estar sentada a ponerse de pie.  · Se da vuelta en la cama.  · Tose o estornuda.  · Hace actividad física.  Siga estas indicaciones en su casa:    · Controle su afección para detectar cualquier cambio.  · Cuando el dolor comience, relájese. Luego pruebe cualquiera de estos métodos para aliviar el dolor:  ? Sentarse.  ? Flexionar las rodillas hacia el abdomen.  ? Acostarse de costado con una almohada debajo del abdomen y otra entre las piernas.  ? Sentarse en una bañera con agua tibia durante 15 a 20 minutos o hasta que el dolor desaparezca.  · Tome los medicamentos de venta libre y los recetados solamente como se lo haya indicado el médico.  · Muévase lentamente cuando se siente o se ponga de pie.  · No haga caminatas largas si le generan dolor.  · Suspenda o reduzca las actividades físicas si le generan dolor.  · Concurra a todas las visitas de control como se lo haya indicado el médico. Esto es importante.  Comuníquese con un médico si:  · El dolor no desaparece con el tratamiento.  · Tiene un dolor en la espalda que no tenía antes.  · El medicamento no resulta  eficaz.  Solicite ayuda inmediatamente si:  · Tiene fiebre o escalofríos.  · Tiene contracciones uterinas.  · Tiene una hemorragia vaginal abundante.  · Tiene náuseas o vómitos.  · Tiene diarrea.  · Siente dolor al orinar.  Resumen  · El dolor del ligamento redondo se siente en la parte inferior del abdomen o la ingle. Generalmente es un dolor agudo y punzante, y dura poco tiempo. También puede ser un dolor sordo, persistente y continuo.  · Este dolor por lo general empieza en el segundo trimestre (semanas 13 a 28). Se produce porque el útero se estira a medida que el bebé crece, y no es perjudicial para el bebé.  · Usted puede notar el dolor cuando cambia súbitamente de posición, cuando toce o estornuda, o durante la actividad física.  · Relajarse, flexionar las rodillas hacia el abdomen, acostarse sobre un lado o tomar un baño de agua tibia pueden ayudar a eliminar el dolor.  · Solicite ayuda a su médico si el dolor no desaparece o si tiene hemorragia vaginal, náuseas, vómitos, diarrea o dolor al orinar.  Esta información no tiene como fin reemplazar el consejo del médico. Asegúrese de hacerle al médico cualquier pregunta que tenga.  Document Revised: 08/03/2017 Document Reviewed: 08/03/2017  Elsevier Patient Education © 2021 Elsevier Inc.

## 2020-06-08 ENCOUNTER — Ambulatory Visit (HOSPITAL_COMMUNITY)
Admission: EM | Admit: 2020-06-08 | Discharge: 2020-06-08 | Disposition: A | Discharge status: Home / Self Care | Payer: No Payment, Other | Attending: Psychiatry | Admitting: Psychiatry

## 2020-06-08 ENCOUNTER — Other Ambulatory Visit: Payer: Self-pay

## 2020-06-08 ENCOUNTER — Encounter (HOSPITAL_COMMUNITY): Payer: Self-pay | Admitting: Registered Nurse

## 2020-06-08 DIAGNOSIS — O99342 Other mental disorders complicating pregnancy, second trimester: Secondary | ICD-10-CM | POA: Insufficient documentation

## 2020-06-08 DIAGNOSIS — O906 Postpartum mood disturbance: Secondary | ICD-10-CM | POA: Insufficient documentation

## 2020-06-08 DIAGNOSIS — F53 Postpartum depression: Secondary | ICD-10-CM

## 2020-06-08 DIAGNOSIS — R45850 Homicidal ideations: Secondary | ICD-10-CM | POA: Insufficient documentation

## 2020-06-08 DIAGNOSIS — F32A Depression, unspecified: Secondary | ICD-10-CM | POA: Insufficient documentation

## 2020-06-08 DIAGNOSIS — Z3A00 Weeks of gestation of pregnancy not specified: Secondary | ICD-10-CM | POA: Insufficient documentation

## 2020-06-08 MED ORDER — HYDROXYZINE PAMOATE 25 MG PO CAPS: 25.0000 mg | ORAL_CAPSULE | Freq: Three times a day (TID) | ORAL | 0 refills | Status: DC | PRN

## 2020-06-08 NOTE — ED Notes (Signed)
Discharge instructions provided and PT stated understanding. Pt alert, orient and ambulatory. Pt escorted to front lobby to go home. Safety maintained.

## 2020-06-08 NOTE — ED Provider Notes (Signed)
[facility] Medical Screening Exam  Patient Name: Carolyn Gomez MRN: [id number] Date of Evaluation: 06/08/20 Chief Complaint:   Diagnosis:  Final diagnoses:  Postpartum depression    History of Present illness: Carolyn Gomez is a 30 y.o. female patient presented to [facility] as a walk in  Alone  with complaints of "I am having thoughts of hurting my baby".  Carolyn Gomez, 76 y.o., female patient seen face to face by this provider, consulted with Dr. Lucianne Muss; and chart reviewed on 06/08/20.    Used Wall-E for spanish interpretation was used for this evaluation.Patient is spanish speaking only. During evaluation Carolyn Gomez is in sitting in no acute distress.  She is alert, oriented x 4, anxious and cooperative. His mood is depressed with congruent affect. She is tearful at times.  States she sleeps 5 hours per night, but it is broken at times.  States she has not eaten in 2 days, except for fluids.  She does not appear to be responding to internal/external stimuli or delusional thoughts.  Patient denies suicidal/self-harm ideation, psychosis, and paranoia.  Patient endorses homicidal ideations she calls "fleeting thoughts"  for the past 2 days of "I want to hurt my baby".  States, " I love my baby I would never hurt her, it just scares me that I am having these thoughts".  She has no intent of plan in place. She is holding her 77 mo old during evaluation and is nurturing towards child and sooths child when she is upset. She breast feeds during evaluation as well.  States 1 year ago she had postpartum depression when she was pregnant with her first child who is currently 34 months old.  Patient states she was referred to a psychiatrist but she never followed up.  She is currently 4 months pregnant.  States that she has not had any prenatal follow-up thus far.  States that she had an appointment in place but she got confused about the dates and missed the  appointment.  Patient states that she will call and reschedule her appointment and follow-up.  She is currently taking prenatal vitamins.  Patient contracted for safety.  States she feels safe to go home with her child.  Patient agrees to safety plan with spouse to have 24 hour supervision. States her mother can come live with them for a few weeks until she is stabilized. Denies access to firearms/weopons.  Patient states she is feels safe at home.  States her spouse is very supportive and loving.  States she feels like her family is not supportive.  Case was consulted with Dr. Lucianne Muss.  Hydroxyzine 25 mg :PO TID PRN prescription was printed for patient.  Discussed medication and medication safety profile when it comes to pregnancy and lactation.  Explained to patient to only take medication if she needed it for severe anxiety.  Discussed with patient sensitivity of medications with her being pregnant and breast-feeding her 83-month-old.  Patient agreed to follow-up with medication management with a psychiatrist and with outpatient therapeutic services.  Explained to patient hydroxyzine could potentially make her sleepy, instructed to only take if someone was there to supervise her child..  Collateral obtained from spouse Carolyn Gomez: Spouse states he is aware of patient's depression.  Discussed with spouse patient's symptoms have worsened over the past week and discussed patient's thoughts of wanting to hurt their child.  Spouse was sympathetic.  Spouse came and picked up the 55-month-old from the waiting room so  patient could finish her assessment.  Spouse agrees to have 24-hour care with the patient for the next few weeks until she is stabilized.  States they have family support, her mother can come stay for a few weeks.  Spouse had no immediate safety concerns with patient returning home.  Provided resources for outpatient psychiatric services with spouse via telephone.  Spouse agrees to help patient make  appointment and follow-up   Psychiatric Specialty Exam  Presentation  General Appearance:Fairly Groomed  Eye Contact:Minimal  Speech:Clear and Coherent  Speech Volume:Normal  Handedness:Right   Mood and Affect  Mood:Depressed  Affect:Congruent; Depressed   Thought Process  Thought Processes:Coherent  Descriptions of Associations:Intact  Orientation:Full (Time, Place and Person)  Thought Content:Logical    Hallucinations:None  Ideas of Reference:None  Suicidal Thoughts:No  Homicidal Thoughts:Yes, Passive Without Intent; Without Plan   Sensorium  Memory:Immediate Good; Recent Good; Remote Good  Judgment:Good  Insight:Good   Executive Functions  Concentration:Good  Attention Span:Good  Recall:Good  Fund of Knowledge:Good  Language:Good   Psychomotor Activity  Psychomotor Activity:Normal   Assets  Assets:Communication Skills; Desire for Improvement; Financial Resources/Insurance; Housing; Physical Health; Resilience; Social Support; Transportation   Sleep  Sleep:Fair  Number of hours: 5   No data recorded  Physical Exam: Physical Exam Vitals reviewed.  Constitutional:      Appearance: Normal appearance.  HENT:     Head: Normocephalic.     Right Ear: External ear normal.     Left Ear: External ear normal.     Mouth/Throat:     Pharynx: Oropharynx is clear.  Eyes:     Conjunctiva/sclera: Conjunctivae normal.  Cardiovascular:     Rate and Rhythm: Normal rate.     Pulses: Normal pulses.  Pulmonary:     Effort: Pulmonary effort is normal. No respiratory distress.  Musculoskeletal:        General: Normal range of motion.     Cervical back: Normal range of motion.  Skin:    General: Skin is warm.  Neurological:     Mental Status: She is alert and oriented to person, place, and time.  Psychiatric:        Attention and Perception: Attention and perception normal.        Mood and Affect: Mood is depressed. Affect is tearful.         Speech: Speech normal.        Behavior: Behavior normal. Behavior is cooperative.        Thought Content: Thought content includes homicidal ideation. Thought content does not include homicidal plan.        Cognition and Memory: Cognition normal.        Judgment: Judgment normal.    Review of Systems  Constitutional: Negative.   HENT: Negative.   Respiratory: Negative.   Cardiovascular: Negative.   Musculoskeletal: Negative.   Skin: Negative.   Neurological: Negative.   Psychiatric/Behavioral: Positive for depression. The patient is nervous/anxious.    Blood pressure 120/73, pulse 93, temperature 98.3 F (36.8 C), temperature source Oral, resp. rate 16, last menstrual period 02/11/2020, SpO2 100 %, currently breastfeeding. There is no height or weight on file to calculate BMI.  Musculoskeletal: Strength & Muscle Tone: within normal limits Gait & Station: normal Patient leans: N/A   [facility] MSE Discharge Disposition for Follow up and Recommendations: Based on my evaluation the patient does not appear to have an emergency medical condition and can be discharged with resources and follow up care in outpatient services for  Individual Therapy  Prescription for hydralazine 25 mg p.o. 3 times daily printed for patient.  Safety plan with patient and spouse implemented.  Outpatient psychiatric resources provided in Spanish to patient for [facility] and [facility] walk in hours.   Addition information given to patient: Managing Depression. Postpartum Baby Blues, Suicidal Feelings and Helping Someone Who is Suicidal- all in [place].   Ardis Hughs, NP 06/08/2020, 7:06 PM

## 2020-06-08 NOTE — BH Assessment (Signed)
Patient is a 30 year old female that presents this date with post partum depression requesting assistance for ongoing needs. Patient speaks Spanish and Achilles Dunk was utilized for purposes of conducting assessment. Patient denies any S/I, H/I or AVH. Patient denies any previous attempts or gestures at self harm. Patient denies any intent to harm her current 54 month old child although reports ongoing depression and feeling overwhelmed. Effie Shy NP evaluated patient and recommended patient be discharged with safety planning and resources.

## 2020-07-15 ENCOUNTER — Encounter: Payer: Self-pay | Admitting: *Deleted

## 2020-07-16 ENCOUNTER — Ambulatory Visit (INDEPENDENT_AMBULATORY_CARE_PROVIDER_SITE_OTHER): Payer: Self-pay | Admitting: Obstetrics and Gynecology

## 2020-07-16 ENCOUNTER — Other Ambulatory Visit: Payer: Self-pay

## 2020-07-16 ENCOUNTER — Encounter: Payer: Self-pay | Admitting: Obstetrics and Gynecology

## 2020-07-16 ENCOUNTER — Encounter: Payer: Self-pay | Admitting: General Practice

## 2020-07-16 VITALS — BP 116/63 | HR 87 | Wt 194.2 lb

## 2020-07-16 DIAGNOSIS — O09899 Supervision of other high risk pregnancies, unspecified trimester: Secondary | ICD-10-CM

## 2020-07-16 DIAGNOSIS — F53 Postpartum depression: Secondary | ICD-10-CM

## 2020-07-16 DIAGNOSIS — O0990 Supervision of high risk pregnancy, unspecified, unspecified trimester: Secondary | ICD-10-CM

## 2020-07-16 DIAGNOSIS — O0930 Supervision of pregnancy with insufficient antenatal care, unspecified trimester: Secondary | ICD-10-CM | POA: Insufficient documentation

## 2020-07-16 DIAGNOSIS — Z789 Other specified health status: Secondary | ICD-10-CM | POA: Insufficient documentation

## 2020-07-16 DIAGNOSIS — O99345 Other mental disorders complicating the puerperium: Secondary | ICD-10-CM

## 2020-07-16 NOTE — Assessment & Plan Note (Signed)
  Nursing Staff Provider  Office Location  [facility] Dating    Language  Spanish Anatomy US    Flu Vaccine  Yes 2021 Genetic/Carrier Screen  NIPS:    AFP:    Horizon:  TDaP Vaccine    Hgb A1C or  GTT Early  Third trimester   COVID Vaccine No   LAB RESULTS   Rhogam   Blood Type --/--/O POS (07/24 0551)   Baby Feeding Plan  Both Antibody NEG (07/24 0551)  Contraception  Pill Rubella    Circumcision No RPR NON REACTIVE (07/24 0553)   Pediatrician  Undecided HBsAg     Support Person FOB HCVAb   Prenatal Classes  HIV       BTL Consent  GBS   (For PCN allergy, check sensitivities)   VBAC Consent  Pap        DME Rx [ ]  BP cuff [ ]  Weight Scale Waterbirth  [ ]  Class [ ]  Consent [ ]  CNM visit  PHQ9 & GAD7 [ X ] new OB [  ] 28 weeks  [  ] 36 weeks Induction  [ ]  Orders Entered [ ] Foley Y/N

## 2020-07-16 NOTE — Progress Notes (Signed)
New OB Note  07/16/2020   Clinic: [facility]  Chief Complaint: new OB  Transfer of Care Patient: no  History of Present Illness: Ms. Carolyn Gomez is a 30 y.o. [id number] @ 23/3 weeks (EDC 11/7, based on 6wk u/s) Patient's last menstrual period was 02/11/2020 (approximate). Preg complicated by has Postpartum depression; History of preterm delivery, currently pregnant; Short interval between pregnancies affecting pregnancy, antepartum; Language barrier; and Late prenatal care affecting pregnancy, antepartum on their problem list.   Any events prior to today's visit: no Her periods were: somewhat regular after last child.  She was using no method when she conceived.  She has Negative signs or symptoms of nausea/vomiting of pregnancy. She has Negative signs or symptoms of miscarriage or preterm labor  ROS: A 12-point review of systems was performed and negative, except as stated in the above HPI.  OBGYN History: As per HPI. OB History  Gravida Para Term Preterm AB Living  3 2 1 1  0 2  SAB IAB Ectopic Multiple Live Births  0 0 0 0 1    # Outcome Date GA Lbr Len/2nd Weight Sex Delivery Anes PTL Lv  3 Current           2 Preterm 07/27/19 [redacted]w[redacted]d  5 lb 1 oz (2.296 kg) F Vag-Spont        Complications: History of preterm premature rupture of membranes (PPROM)  1 Term 07/14/06 [redacted]w[redacted]d  6 lb 13.4 oz (3.1 kg) M Vag-Spont   LIV     Complications: Other Excessive Bleeding    Obstetric Comments  Per patient, patient had anemia in last pregnancy and required transfusion after delivery. She reports previously her anemia was 'severe' and required 1 unit pRBC postpartum.    Postpartum she reports she had some heavy bleeding but this resolved.     Any issues with any prior pregnancies: 36wk pprom SVD Prior children are healthy, doing well, and without any problems or issues: yes History of pap smears: Yes. Last pap smear 2021 and results were negative   Past  Medical History: Past Medical History:  Diagnosis Date   Medical history non-contributory     Past Surgical History: Past Surgical History:  Procedure Laterality Date   NO PAST SURGERIES      Family History:  No family history on file. She denies any history of mental retardation, birth defects or genetic disorders in her or the FOB's history  Social History:  Social History   Socioeconomic History   Marital status: Single    Spouse name: Not on file   Number of children: Not on file   Years of education: Not on file   Highest education level: Not on file  Occupational History   Not on file  Tobacco Use   Smoking status: Never   Smokeless tobacco: Never  Vaping Use   Vaping Use: Never used  Substance and Sexual Activity   Alcohol use: Never    Comment: occasionally drink a beer   Drug use: Never   Sexual activity: Yes    Birth control/protection: Injection  Other Topics Concern   Not on file  Social History Narrative   Not on file   Social Determinants of Health   Financial Resource Strain: Not on file  Food Insecurity: Not on file  Transportation Needs: Not on file  Physical Activity: Not on file  Stress: Not on file  Social Connections: Not on file  Intimate Partner Violence: Not on file  Allergy: No Known Allergies   Current Outpatient Medications: Prenatal vitamin  Physical Exam:   BP 116/63   Pulse 87   Wt 194 lb 3.2 oz (88.1 kg)   LMP 02/11/2020 (Approximate)   BMI 36.69 kg/m  Body mass index is 36.69 kg/m. Contractions: Irritability Vag. Bleeding: None. Fundal height: 26 FHTs: 150s  General appearance: Well nourished, well developed female in no acute distress.  Neck:  Supple, normal appearance, and no thyromegaly  Cardiovascular: S1, S2 normal, no murmur, rub or gallop, regular rate and rhythm Respiratory:  Clear to auscultation bilateral. Normal respiratory effort Abdomen: positive bowel sounds and no masses, hernias; diffusely  non tender to palpation, non distended Breasts: patient denies any s/s. Neuro/Psych:  Normal mood and affect.  Skin:  Warm and dry.   Laboratory: none  Imaging:  Study Result  Narrative & Impression  CLINICAL DATA:  Pregnancy of unknown location   EXAM: TRANSVAGINAL OB ULTRASOUND   TECHNIQUE: Transvaginal ultrasound was performed for complete evaluation of the gestation as well as the maternal uterus, adnexal regions, and pelvic cul-de-sac.   COMPARISON:  03/02/2020   FINDINGS: Intrauterine gestational sac: Single   Yolk sac:  Visualized.   Embryo:  Visualized.   Cardiac Activity: Visualized.   Heart Rate: 127 bpm   CRL:   5.3 mm   6 w 2 d                  [place] EDC: 11/09/2020   Subchorionic hemorrhage: Equivocal for 7 mm clot along the lower gestational sac.   Maternal uterus/adnexae: Physiologic appearance.   IMPRESSION: 1. Single living intrauterine pregnancy measuring 6 weeks 2 days. 2. Possible 7 mm subchorionic hemorrhage.     Electronically Signed   By: Marnee Spring M.D.   On: 03/18/2020 10:41    Assessment: patient stable  Plan: 1. Short interval between pregnancies affecting pregnancy, antepartum - Genetic Screening - CBC/D/Plt+RPR+Rh+ABO+RubIgG... - Hemoglobin A1c - CHL AMB BABYSCRIPTS SCHEDULE OPTIMIZATION - Culture, OB Urine  2. Late prenatal care affecting pregnancy, antepartum  3. History of preterm delivery, currently pregnant Not a 17p candidate  4. Language barrier Interpreter used  5. Postpartum depression Not needing the PRN vistaril. Continue to follow closely  6. Supervision of high risk pregnancy, antepartum Standard labs and anatomy u/s ordered. 2h GTT nv - [place] MFM OB DETAIL +14 WK; Future - TSH - Comprehensive metabolic panel - Protein / creatinine ratio, urine  Problem list reviewed and updated.  Follow up in 3 weeks.  >50% of 30 min visit spent on counseling and coordination of care.     Cornelia Copa MD Attending Center for [facility])

## 2020-07-17 LAB — CBC/D/PLT+RPR+RH+ABO+RUBIGG...
Antibody Screen: NEGATIVE
Basophils Absolute: 0 10*3/uL (ref 0.0–0.2)
Basos: 0 %
EOS (ABSOLUTE): 0.2 10*3/uL (ref 0.0–0.4)
Eos: 2 %
HCV Ab: <0.1 s/co ratio (ref 0.0–0.9)
HIV Screen 4th Generation wRfx: NONREACTIVE
Hematocrit: 32.3 % — ABNORMAL LOW (ref 34.0–46.6)
Hemoglobin: 10.4 g/dL — ABNORMAL LOW (ref 11.1–15.9)
Hepatitis B Surface Ag: NEGATIVE
Immature Grans (Abs): 0.1 10*3/uL (ref 0.0–0.1)
Immature Granulocytes: 1 %
Lymphocytes Absolute: 2.2 10*3/uL (ref 0.7–3.1)
Lymphs: 19 %
MCH: 27.2 pg (ref 26.6–33.0)
MCHC: 32.2 g/dL (ref 31.5–35.7)
MCV: 85 fL (ref 79–97)
Monocytes Absolute: 0.6 10*3/uL (ref 0.1–0.9)
Monocytes: 5 %
Neutrophils Absolute: 8.4 10*3/uL — ABNORMAL HIGH (ref 1.4–7.0)
Neutrophils: 73 %
Platelets: 481 10*3/uL — ABNORMAL HIGH (ref 150–450)
RBC: 3.82 x10E6/uL (ref 3.77–5.28)
RDW: 13.2 % (ref 11.7–15.4)
RPR Ser Ql: NONREACTIVE
Rh Factor: POSITIVE
Rubella Antibodies, IGG: 10.4 index (ref >0.99)
WBC: 11.6 10*3/uL — ABNORMAL HIGH (ref 3.4–10.8)

## 2020-07-17 LAB — COMPREHENSIVE METABOLIC PANEL
ALT: 13 IU/L (ref 0–32)
AST: 16 IU/L (ref 0–40)
Albumin/Globulin Ratio: 1.4 (ref 1.2–2.2)
Albumin: 3.9 g/dL (ref 3.8–4.8)
Alkaline Phosphatase: 82 IU/L (ref 44–121)
BUN/Creatinine Ratio: 14 (ref 9–23)
BUN: 6 mg/dL (ref 6–20)
Bilirubin Total: 0.2 mg/dL (ref 0.0–1.2)
CO2: 19 mmol/L — ABNORMAL LOW (ref 20–29)
Calcium: 8.8 mg/dL (ref 8.7–10.2)
Chloride: 104 mmol/L (ref 96–106)
Creatinine, Ser: 0.44 mg/dL — ABNORMAL LOW (ref 0.57–1.00)
Globulin, Total: 2.8 g/dL (ref 1.5–4.5)
Glucose: 81 mg/dL (ref 65–99)
Potassium: 4.5 mmol/L (ref 3.5–5.2)
Sodium: 138 mmol/L (ref 134–144)
Total Protein: 6.7 g/dL (ref 6.0–8.5)
eGFR: 133 mL/min/{1.73_m2} (ref >59)

## 2020-07-17 LAB — TSH: TSH: 1.96 u[IU]/mL (ref 0.450–4.500)

## 2020-07-17 LAB — HCV INTERPRETATION

## 2020-07-17 LAB — PROTEIN / CREATININE RATIO, URINE
Creatinine, Urine: 134 mg/dL
Protein, Ur: 12.7 mg/dL
Protein/Creat Ratio: 95 mg/g creat (ref 0–200)

## 2020-07-17 LAB — HEMOGLOBIN A1C
Est. average glucose Bld gHb Est-mCnc: 97 mg/dL
Hgb A1c MFr Bld: 5 % (ref 4.8–5.6)

## 2020-07-18 LAB — CULTURE, OB URINE

## 2020-07-18 LAB — URINE CULTURE, OB REFLEX: Organism ID, Bacteria: NO GROWTH

## 2020-07-19 ENCOUNTER — Encounter: Payer: Self-pay | Admitting: Obstetrics and Gynecology

## 2020-07-19 DIAGNOSIS — O0990 Supervision of high risk pregnancy, unspecified, unspecified trimester: Secondary | ICD-10-CM | POA: Insufficient documentation

## 2020-08-05 ENCOUNTER — Other Ambulatory Visit: Payer: Self-pay | Admitting: General Practice

## 2020-08-05 DIAGNOSIS — O0990 Supervision of high risk pregnancy, unspecified, unspecified trimester: Secondary | ICD-10-CM

## 2020-08-06 ENCOUNTER — Encounter: Payer: Self-pay | Admitting: Obstetrics and Gynecology

## 2020-08-06 ENCOUNTER — Ambulatory Visit (INDEPENDENT_AMBULATORY_CARE_PROVIDER_SITE_OTHER): Payer: Self-pay | Admitting: Obstetrics & Gynecology

## 2020-08-06 ENCOUNTER — Encounter: Payer: Self-pay | Admitting: *Deleted

## 2020-08-06 ENCOUNTER — Other Ambulatory Visit: Payer: Self-pay | Admitting: *Deleted

## 2020-08-06 ENCOUNTER — Other Ambulatory Visit: Payer: Self-pay

## 2020-08-06 ENCOUNTER — Ambulatory Visit: Payer: Self-pay | Admitting: *Deleted

## 2020-08-06 ENCOUNTER — Ambulatory Visit: Payer: Self-pay | Attending: Obstetrics and Gynecology

## 2020-08-06 VITALS — BP 115/61 | HR 98

## 2020-08-06 VITALS — BP 105/74 | HR 99 | Wt 198.2 lb

## 2020-08-06 DIAGNOSIS — Z3689 Encounter for other specified antenatal screening: Secondary | ICD-10-CM

## 2020-08-06 DIAGNOSIS — O99013 Anemia complicating pregnancy, third trimester: Secondary | ICD-10-CM

## 2020-08-06 DIAGNOSIS — O09899 Supervision of other high risk pregnancies, unspecified trimester: Secondary | ICD-10-CM

## 2020-08-06 DIAGNOSIS — O0990 Supervision of high risk pregnancy, unspecified, unspecified trimester: Secondary | ICD-10-CM

## 2020-08-06 DIAGNOSIS — O3660X0 Maternal care for excessive fetal growth, unspecified trimester, not applicable or unspecified: Secondary | ICD-10-CM | POA: Insufficient documentation

## 2020-08-06 DIAGNOSIS — F53 Postpartum depression: Secondary | ICD-10-CM

## 2020-08-06 DIAGNOSIS — O99345 Other mental disorders complicating the puerperium: Secondary | ICD-10-CM

## 2020-08-06 DIAGNOSIS — O0932 Supervision of pregnancy with insufficient antenatal care, second trimester: Secondary | ICD-10-CM

## 2020-08-06 NOTE — Progress Notes (Signed)
   PRENATAL VISIT NOTE  Subjective:  [name] is a 30 y.o. [phone] at 108w3d being seen today for ongoing prenatal care.  She is currently monitored for the following issues for this high-risk pregnancy and has Postpartum depression; History of preterm delivery, currently pregnant; Short interval between pregnancies affecting pregnancy, antepartum; Language barrier; Late prenatal care affecting pregnancy, antepartum; and Supervision of high risk pregnancy, antepartum on their problem list.  Patient reports no complaints.  Contractions: Irritability. Vag. Bleeding: None.  Movement: Present. Denies leaking of fluid.   The following portions of the patient's history were reviewed and updated as appropriate: allergies, current medications, past family history, past medical history, past social history, past surgical history and problem list.   Objective:   Vitals:   08/06/20 0841  BP: 105/74  Pulse: 99  Weight: 198 lb 3.2 oz (89.9 kg)    Fetal Status: Fetal Heart Rate (bpm): 144   Movement: Present     General:  Alert, oriented and cooperative. Patient is in no acute distress.  Skin: Skin is warm and dry. No rash noted.   Cardiovascular: Normal heart rate noted  Respiratory: Normal respiratory effort, no problems with respiration noted  Abdomen: Soft, gravid, appropriate for gestational age.  Pain/Pressure: Present     Pelvic: Cervical exam deferred        Extremities: Normal range of motion.  Edema: None  Mental Status: Normal mood and affect. Normal behavior. Normal judgment and thought content.   Assessment and Plan:  Pregnancy: G3P1102 at [redacted]w[redacted]d 1. Supervision of high risk pregnancy, antepartum Declined genetic testing Undergoing GCT today and anatomy scan today  2. History of preterm delivery, currently pregnant Rare contractions  3. Postpartum depression No depression today   4.  Food insecurity Taking to food market today with Eda.   Preterm labor symptoms  and general obstetric precautions including but not limited to vaginal bleeding, contractions, leaking of fluid and fetal movement were reviewed in detail with the patient. Please refer to After Visit Summary for other counseling recommendations.   No follow-ups on file.  Future Appointments  Date Time Provider Department Center  08/06/2020  9:15 AM Lesly Dukes, MD [facility]  08/06/2020  1:00 PM [facility] NURSE [facility]  08/06/2020  1:15 PM [facility] US2 [facility]    Elsie Lincoln, MD

## 2020-08-07 LAB — RPR: RPR Ser Ql: NONREACTIVE

## 2020-08-07 LAB — CBC
Hematocrit: 30.4 % — ABNORMAL LOW (ref 34.0–46.6)
Hemoglobin: 9.8 g/dL — ABNORMAL LOW (ref 11.1–15.9)
MCH: 25.7 pg — ABNORMAL LOW (ref 26.6–33.0)
MCHC: 32.2 g/dL (ref 31.5–35.7)
MCV: 80 fL (ref 79–97)
Platelets: 499 10*3/uL — ABNORMAL HIGH (ref 150–450)
RBC: 3.82 x10E6/uL (ref 3.77–5.28)
RDW: 12.7 % (ref 11.7–15.4)
WBC: 10.2 10*3/uL (ref 3.4–10.8)

## 2020-08-07 LAB — HIV ANTIBODY (ROUTINE TESTING W REFLEX): HIV Screen 4th Generation wRfx: NONREACTIVE

## 2020-08-10 ENCOUNTER — Other Ambulatory Visit: Payer: Self-pay | Admitting: Obstetrics & Gynecology

## 2020-08-10 ENCOUNTER — Encounter: Payer: Self-pay | Admitting: Obstetrics & Gynecology

## 2020-08-10 DIAGNOSIS — O99019 Anemia complicating pregnancy, unspecified trimester: Secondary | ICD-10-CM | POA: Insufficient documentation

## 2020-08-10 DIAGNOSIS — O99013 Anemia complicating pregnancy, third trimester: Secondary | ICD-10-CM

## 2020-08-10 NOTE — Addendum Note (Signed)
Addended by: Kathee Delton on: 08/10/2020 03:13 PM   Modules accepted: Orders

## 2020-08-11 NOTE — Addendum Note (Signed)
Addended by: Kathee Delton on: 08/11/2020 11:41 AM   Modules accepted: Orders

## 2020-08-14 LAB — SPECIMEN STATUS REPORT

## 2020-08-14 LAB — FOLATE RBC: Hematocrit: 30.4 % — ABNORMAL LOW (ref 34.0–46.6)

## 2020-08-14 LAB — FERRITIN: Ferritin: 5 ng/mL — ABNORMAL LOW (ref 15–150)

## 2020-08-14 LAB — VITAMIN B12: Vitamin B-12: 260 pg/mL (ref 232–1245)

## 2020-08-20 ENCOUNTER — Other Ambulatory Visit: Payer: Self-pay | Admitting: Obstetrics & Gynecology

## 2020-08-20 MED ORDER — FERROUS SULFATE 325 (65 FE) MG PO TABS: 325.0000 mg | ORAL_TABLET | Freq: Every day | ORAL | 3 refills | Status: DC

## 2020-08-20 MED ORDER — DOCUSATE SODIUM 100 MG PO CAPS: 100.0000 mg | ORAL_CAPSULE | Freq: Two times a day (BID) | ORAL | 2 refills | Status: DC | PRN

## 2020-08-20 NOTE — Progress Notes (Signed)
RN to call patient and discuss how to take iron appropriately and avoiding constipation.  Needs interpreter.

## 2020-08-20 NOTE — Progress Notes (Addendum)
Called pt with interpreter Eda. Instructed pt to take iron tablet daily with orange juice. Explained this can cause constipation and pt should pick up stool softener prescription to have on hand. Encouraged pt to increase water intake and to take stool softener if bowel movement becomes less frequent than once a day.

## 2020-08-26 ENCOUNTER — Encounter: Payer: Self-pay | Admitting: Nurse Practitioner

## 2020-08-26 ENCOUNTER — Ambulatory Visit (INDEPENDENT_AMBULATORY_CARE_PROVIDER_SITE_OTHER): Payer: Self-pay | Admitting: Nurse Practitioner

## 2020-08-26 ENCOUNTER — Other Ambulatory Visit: Payer: Self-pay

## 2020-08-26 VITALS — BP 112/66 | HR 88

## 2020-08-26 DIAGNOSIS — O0990 Supervision of high risk pregnancy, unspecified, unspecified trimester: Secondary | ICD-10-CM

## 2020-08-26 DIAGNOSIS — Z3A29 29 weeks gestation of pregnancy: Secondary | ICD-10-CM

## 2020-08-26 DIAGNOSIS — Z23 Encounter for immunization: Secondary | ICD-10-CM

## 2020-08-26 DIAGNOSIS — O09899 Supervision of other high risk pregnancies, unspecified trimester: Secondary | ICD-10-CM

## 2020-08-26 DIAGNOSIS — Z789 Other specified health status: Secondary | ICD-10-CM

## 2020-08-26 NOTE — Progress Notes (Signed)
    Subjective:  Carolyn Gomez is a 30 y.o. [phone] at [redacted]w[redacted]d being seen today for ongoing prenatal care.  She is currently monitored for the following issues for this high-risk pregnancy and has Postpartum depression; History of preterm delivery, currently pregnant; Short interval between pregnancies affecting pregnancy, antepartum; Language barrier; Late prenatal care affecting pregnancy, antepartum; Supervision of high risk pregnancy, antepartum; LGA (large for gestational age) fetus affecting management of mother; and Anemia affecting pregnancy on their problem list.  Patient reports no complaints.  Contractions: Irritability. Vag. Bleeding: None.  Movement: Present. Denies leaking of fluid.   The following portions of the patient's history were reviewed and updated as appropriate: allergies, current medications, past family history, past medical history, past social history, past surgical history and problem list. Problem list updated.  Objective:   Vitals:   08/26/20 0922  BP: 112/66  Pulse: 88    Fetal Status: Fetal Heart Rate (bpm): 154 Fundal Height: 33 cm Movement: Present     General:  Alert, oriented and cooperative. Patient is in no acute distress.  Skin: Skin is warm and dry. No rash noted.   Cardiovascular: Normal heart rate noted  Respiratory: Normal respiratory effort, no problems with respiration noted  Abdomen: Soft, gravid, appropriate for gestational age. Pain/Pressure: Absent     Pelvic:  Cervical exam deferred        Extremities: Normal range of motion.  Edema: Trace  Mental Status: Normal mood and affect. Normal behavior. Normal judgment and thought content.   Urinalysis:      Assessment and Plan:  Pregnancy: G3P1102 at [redacted]w[redacted]d  1. Supervision of high risk pregnancy, antepartum Glucola started at a previous visit but left before test was finished Will return tomorrow for fasting glucola and other labs Watch fundal height - has another [place] scheduled on  09-03-20 Got TDAP today  - Tdap vaccine greater than or equal to 7yo IM  2. Language barrier In person interpreter present  3. History of preterm delivery, currently pregnant   4. [redacted] weeks gestation of pregnancy   Preterm labor symptoms and general obstetric precautions including but not limited to vaginal bleeding, contractions, leaking of fluid and fetal movement were reviewed in detail with the patient. Please refer to After Visit Summary for other counseling recommendations.  Return in about 2 weeks (around 09/09/2020) for [facility] in person.  Nolene Bernheim, RN, MSN, NP-BC Nurse Practitioner, [facility] for [employer], [facility] 08/26/2020 5:33 PM

## 2020-08-27 ENCOUNTER — Other Ambulatory Visit: Payer: Self-pay

## 2020-08-27 ENCOUNTER — Other Ambulatory Visit: Payer: Self-pay | Admitting: General Practice

## 2020-08-27 DIAGNOSIS — O0990 Supervision of high risk pregnancy, unspecified, unspecified trimester: Secondary | ICD-10-CM

## 2020-08-28 LAB — GLUCOSE TOLERANCE, 2 HOURS W/ 1HR
Glucose, 1 hour: 159 mg/dL (ref 65–179)
Glucose, 2 hour: 117 mg/dL (ref 65–152)
Glucose, Fasting: 83 mg/dL (ref 65–91)

## 2020-09-03 ENCOUNTER — Other Ambulatory Visit: Payer: Self-pay

## 2020-09-03 ENCOUNTER — Encounter: Payer: Self-pay | Admitting: *Deleted

## 2020-09-03 ENCOUNTER — Ambulatory Visit: Payer: Self-pay | Attending: Obstetrics and Gynecology

## 2020-09-03 ENCOUNTER — Ambulatory Visit: Payer: Self-pay | Admitting: *Deleted

## 2020-09-03 VITALS — BP 102/55 | HR 99

## 2020-09-03 DIAGNOSIS — O99013 Anemia complicating pregnancy, third trimester: Secondary | ICD-10-CM | POA: Insufficient documentation

## 2020-09-03 DIAGNOSIS — O99213 Obesity complicating pregnancy, third trimester: Secondary | ICD-10-CM

## 2020-09-03 DIAGNOSIS — E669 Obesity, unspecified: Secondary | ICD-10-CM

## 2020-09-03 DIAGNOSIS — O0933 Supervision of pregnancy with insufficient antenatal care, third trimester: Secondary | ICD-10-CM

## 2020-09-03 DIAGNOSIS — O09893 Supervision of other high risk pregnancies, third trimester: Secondary | ICD-10-CM

## 2020-09-03 DIAGNOSIS — Z3A30 30 weeks gestation of pregnancy: Secondary | ICD-10-CM

## 2020-09-03 DIAGNOSIS — O0932 Supervision of pregnancy with insufficient antenatal care, second trimester: Secondary | ICD-10-CM | POA: Insufficient documentation

## 2020-09-16 ENCOUNTER — Ambulatory Visit (INDEPENDENT_AMBULATORY_CARE_PROVIDER_SITE_OTHER): Payer: Self-pay | Admitting: Obstetrics and Gynecology

## 2020-09-16 ENCOUNTER — Other Ambulatory Visit: Payer: Self-pay

## 2020-09-16 VITALS — BP 109/67 | HR 90 | Wt 207.1 lb

## 2020-09-16 DIAGNOSIS — Z789 Other specified health status: Secondary | ICD-10-CM

## 2020-09-16 DIAGNOSIS — O99210 Obesity complicating pregnancy, unspecified trimester: Secondary | ICD-10-CM

## 2020-09-16 DIAGNOSIS — O3663X0 Maternal care for excessive fetal growth, third trimester, not applicable or unspecified: Secondary | ICD-10-CM

## 2020-09-16 DIAGNOSIS — O09899 Supervision of other high risk pregnancies, unspecified trimester: Secondary | ICD-10-CM

## 2020-09-16 DIAGNOSIS — Z6839 Body mass index (BMI) 39.0-39.9, adult: Secondary | ICD-10-CM

## 2020-09-16 DIAGNOSIS — F53 Postpartum depression: Secondary | ICD-10-CM

## 2020-09-16 DIAGNOSIS — O99345 Other mental disorders complicating the puerperium: Secondary | ICD-10-CM

## 2020-09-16 DIAGNOSIS — O0990 Supervision of high risk pregnancy, unspecified, unspecified trimester: Secondary | ICD-10-CM

## 2020-09-16 DIAGNOSIS — O99013 Anemia complicating pregnancy, third trimester: Secondary | ICD-10-CM

## 2020-09-16 DIAGNOSIS — Z758 Other problems related to medical facilities and other health care: Secondary | ICD-10-CM

## 2020-09-16 DIAGNOSIS — Z8759 Personal history of other complications of pregnancy, childbirth and the puerperium: Secondary | ICD-10-CM | POA: Insufficient documentation

## 2020-09-16 NOTE — Addendum Note (Signed)
Addended by:  Bing on: 09/16/2020 10:03 AM   Modules accepted: Orders

## 2020-09-16 NOTE — Progress Notes (Signed)
   PRENATAL VISIT NOTE  Subjective:  [name] is a 30 y.o. [phone] at [redacted]w[redacted]d being seen today for ongoing prenatal care.  She is currently monitored for the following issues for this high-risk pregnancy and has Obesity in pregnancy; Postpartum depression; History of preterm delivery, currently pregnant; Short interval between pregnancies affecting pregnancy, antepartum; Language barrier; Late prenatal care affecting pregnancy, antepartum; Supervision of high risk pregnancy, antepartum; LGA (large for gestational age) fetus affecting management of mother; Anemia affecting pregnancy; History of postpartum hemorrhage; and BMI 39.0-39.9,adult on their problem list.  Patient reports no complaints.  Contractions: Irritability. Vag. Bleeding: None.  Movement: Present. Denies leaking of fluid.   The following portions of the patient's history were reviewed and updated as appropriate: allergies, current medications, past family history, past medical history, past social history, past surgical history and problem list.   Objective:   Vitals:   09/16/20 0945  BP: 109/67  Pulse: 90  Weight: 207 lb 1.6 oz (93.9 kg)    Fetal Status: Fetal Heart Rate (bpm): 143 Fundal Height: 35 cm Movement: Present     General:  Alert, oriented and cooperative. Patient is in no acute distress.  Skin: Skin is warm and dry. No rash noted.   Cardiovascular: Normal heart rate noted  Respiratory: Normal respiratory effort, no problems with respiration noted  Abdomen: Soft, gravid, appropriate for gestational age.  Pain/Pressure: Present     Pelvic: Cervical exam deferred        Extremities: Normal range of motion.  Edema: None  Mental Status: Normal mood and affect. Normal behavior. Normal judgment and thought content.   Assessment and Plan:  Pregnancy: G3P1102 at [redacted]w[redacted]d 1. Anemia affecting pregnancy in third trimester Recheck anemia panel today. D/w her that if still low recommend iv iron  2. Excessive  fetal growth affecting management of pregnancy in third trimester, single or unspecified fetus Rpt growth in 5-6wks.  - [place] MFM OB FOLLOW UP; Future  3. Supervision of high risk pregnancy, antepartum - [place] MFM OB FOLLOW UP; Future  4. Language barrier Interpreter used  5. Short interval between pregnancies affecting pregnancy, antepartum  6. Postpartum depression Doing well today on no meds  7. History of postpartum hemorrhage  8. BMI 39.0-39.9,adult  9. Obesity in pregnancy  Preterm labor symptoms and general obstetric precautions including but not limited to vaginal bleeding, contractions, leaking of fluid and fetal movement were reviewed in detail with the patient. Please refer to After Visit Summary for other counseling recommendations.   Return in about 2 weeks (around 09/30/2020) for md or app, in person, low risk ob.  No future appointments.  Kosse Bing, MD

## 2020-09-17 ENCOUNTER — Telehealth: Payer: Self-pay

## 2020-09-17 LAB — ANEMIA PROFILE B
Basophils Absolute: 0 10*3/uL (ref 0.0–0.2)
Basos: 0 %
EOS (ABSOLUTE): 0.3 10*3/uL (ref 0.0–0.4)
Eos: 3 %
Ferritin: 4 ng/mL — ABNORMAL LOW (ref 15–150)
Folate: 10.1 ng/mL (ref >3.0)
Hematocrit: 27.4 % — ABNORMAL LOW (ref 34.0–46.6)
Hemoglobin: 8.8 g/dL — ABNORMAL LOW (ref 11.1–15.9)
Immature Grans (Abs): 0.1 10*3/uL (ref 0.0–0.1)
Immature Granulocytes: 1 %
Iron Saturation: 4 % — CL (ref 15–55)
Iron: 30 ug/dL (ref 27–159)
Lymphocytes Absolute: 1.9 10*3/uL (ref 0.7–3.1)
Lymphs: 21 %
MCH: 23.5 pg — ABNORMAL LOW (ref 26.6–33.0)
MCHC: 32.1 g/dL (ref 31.5–35.7)
MCV: 73 fL — ABNORMAL LOW (ref 79–97)
Monocytes Absolute: 0.7 10*3/uL (ref 0.1–0.9)
Monocytes: 7 %
Neutrophils Absolute: 6.2 10*3/uL (ref 1.4–7.0)
Neutrophils: 68 %
Platelets: 446 10*3/uL (ref 150–450)
RBC: 3.75 x10E6/uL — ABNORMAL LOW (ref 3.77–5.28)
RDW: 13.7 % (ref 11.7–15.4)
Retic Ct Pct: 1.5 % (ref 0.6–2.6)
Total Iron Binding Capacity: 670 ug/dL (ref 250–450)
UIBC: 640 ug/dL — ABNORMAL HIGH (ref 131–425)
Vitamin B-12: 258 pg/mL (ref 232–1245)
WBC: 9.1 10*3/uL (ref 3.4–10.8)

## 2020-09-17 NOTE — Telephone Encounter (Addendum)
-----   Message from [place] Bing, MD sent at 09/17/2020  8:17 AM EDT ----- Please set her up for IV iron (venofer 500mg  qwk x 2 doses). Orders are in. Thanks   Appt scheduled with [facility] for 09/23/20 at 10AM. Called pt with interpreter Eda. Appt information given.

## 2020-09-17 NOTE — Addendum Note (Signed)
Addended by: [place] Bing on: 09/17/2020 08:17 AM   Modules accepted: Orders

## 2020-09-21 NOTE — Telephone Encounter (Signed)
Secure chat message received from [facility] RN requesting premeds for Venofer infusion this Wednesday. Vergie Living, MD contacted via inbasket to request orders if desired.

## 2020-09-23 ENCOUNTER — Encounter (HOSPITAL_COMMUNITY): Payer: Self-pay

## 2020-10-01 ENCOUNTER — Ambulatory Visit (INDEPENDENT_AMBULATORY_CARE_PROVIDER_SITE_OTHER): Payer: Self-pay | Admitting: Obstetrics and Gynecology

## 2020-10-01 ENCOUNTER — Other Ambulatory Visit: Payer: Self-pay

## 2020-10-01 VITALS — BP 113/73 | HR 85 | Wt 211.3 lb

## 2020-10-01 DIAGNOSIS — Z3A34 34 weeks gestation of pregnancy: Secondary | ICD-10-CM

## 2020-10-01 NOTE — Progress Notes (Signed)
   PRENATAL VISIT NOTE  Subjective:  Carolyn Gomez is a 30 y.o. [phone] at [redacted]w[redacted]d being seen today for ongoing prenatal care.  She is currently monitored for the following issues for this high-risk pregnancy and has Obesity in pregnancy; Postpartum depression; History of preterm delivery, currently pregnant; Short interval between pregnancies affecting pregnancy, antepartum; Language barrier; Late prenatal care affecting pregnancy, antepartum; Supervision of high risk pregnancy, antepartum; LGA (large for gestational age) fetus affecting management of mother; Anemia affecting pregnancy; History of postpartum hemorrhage; and BMI 39.0-39.9,adult on their problem list.  Patient reports no complaints.  Contractions: Irritability. Vag. Bleeding: None.  Movement: Present. Denies leaking of fluid.   The following portions of the patient's history were reviewed and updated as appropriate: allergies, current medications, past family history, past medical history, past social history, past surgical history and problem list.   Objective:   Vitals:   10/01/20 0823  BP: 113/73  Pulse: 85  Weight: 211 lb 4.8 oz (95.8 kg)    Fetal Status: Fetal Heart Rate (bpm): 145   Movement: Present     General:  Alert, oriented and cooperative. Patient is in no acute distress.  Skin: Skin is warm and dry. No rash noted.   Cardiovascular: Normal heart rate noted  Respiratory: Normal respiratory effort, no problems with respiration noted  Abdomen: Soft, gravid, appropriate for gestational age.  Pain/Pressure: Present     Pelvic: Cervical exam deferred        Extremities: Normal range of motion.  Edema: Trace  Mental Status: Normal mood and affect. Normal behavior. Normal judgment and thought content.   Assessment and Plan:  Pregnancy: G3P1102 at [redacted]w[redacted]d 1. Anemia affecting pregnancy in third trimester Patient missed 9/21. Rescheduled today for October. Importance of this d/w her particularly in decreasing her  risk of transfusion    2. Excessive fetal growth affecting management of pregnancy in third trimester, single or unspecified fetus LGA. F/u 10/19 growth. Normal 2h GTT 9/1: 92%, 1931g, ac 97%, afi 20   3. Supervision of high risk pregnancy, antepartum Gbs nv  4. Language barrier Interpreter used   5. Short interval between pregnancies affecting pregnancy, antepartum   6. Postpartum depression Doing well today on no meds   7. History of postpartum hemorrhage   8. BMI 39.0-39.9,adult   9. Obesity in pregnancy Preterm labor symptoms and general obstetric precautions including but not limited to vaginal bleeding, contractions, leaking of fluid and fetal movement were reviewed in detail with the patient. Please refer to After Visit Summary for other counseling recommendations.   No follow-ups on file.  Future Appointments  Date Time Provider Department Center  10/21/2020  2:30 PM [facility] NURSE [facility]  10/21/2020  2:45 PM [facility] US5 [facility]    [place] Bing, MD

## 2020-10-09 ENCOUNTER — Ambulatory Visit (HOSPITAL_COMMUNITY)
Admission: RE | Admit: 2020-10-09 | Discharge: 2020-10-09 | Disposition: A | Discharge status: Home / Self Care | Payer: Medicaid Other | Source: Ambulatory Visit | Attending: Obstetrics and Gynecology | Admitting: Obstetrics and Gynecology

## 2020-10-09 ENCOUNTER — Other Ambulatory Visit: Payer: Self-pay

## 2020-10-09 ENCOUNTER — Other Ambulatory Visit: Payer: Self-pay | Admitting: Obstetrics and Gynecology

## 2020-10-09 DIAGNOSIS — Z3A35 35 weeks gestation of pregnancy: Secondary | ICD-10-CM | POA: Insufficient documentation

## 2020-10-09 DIAGNOSIS — O99013 Anemia complicating pregnancy, third trimester: Secondary | ICD-10-CM | POA: Diagnosis present

## 2020-10-09 MED ORDER — DIPHENHYDRAMINE HCL 25 MG PO CAPS
25.0000 mg | ORAL_CAPSULE | Freq: Once | ORAL | Status: AC
  Administered 2020-10-09: 25 mg via ORAL

## 2020-10-09 MED ORDER — SODIUM CHLORIDE 0.9 % IV SOLN
510.0000 mg | INTRAVENOUS | Status: DC
Start: 2020-10-09 — End: 2020-10-10
  Administered 2020-10-09: 510 mg via INTRAVENOUS
  Filled 2020-10-09: qty 510

## 2020-10-09 MED ORDER — SODIUM CHLORIDE 0.9 % IV SOLN
500.0000 mg | INTRAVENOUS | Status: DC
  Filled 2020-10-09: qty 25

## 2020-10-09 MED ORDER — ACETAMINOPHEN 500 MG PO TABS
1000.0000 mg | ORAL_TABLET | Freq: Once | ORAL | Status: AC
  Administered 2020-10-09: 1000 mg via ORAL

## 2020-10-09 NOTE — Progress Notes (Signed)
Previous note entered by this nurse but was entered under [place], RN log in. Nurse present and aware.

## 2020-10-09 NOTE — Progress Notes (Signed)
Patient arrived at 0800. Once interpreter arrived patient reported being unable to stay for 4-4 1/2 hours due to no child care. Patient reports office told her she would be here for 2 hours. Patient reports never being able to have child care for that many hours. Messaged and then called Dr. Vergie Living. Changed to IV Feraheme for 2 doses. Patient verbalized understanding. Patient also noted to have bilateral hand swelling, right worse than left with scattered hives on right anterior forearm. Patient reported remodeling at her house and feels she may have reacted to dust/environmental allergens. Dr. Vergie Living informed and ordered one time dose of oral Tylenol and Benadryl.

## 2020-10-15 ENCOUNTER — Other Ambulatory Visit: Payer: Self-pay

## 2020-10-15 ENCOUNTER — Other Ambulatory Visit (HOSPITAL_COMMUNITY)
Admission: RE | Admit: 2020-10-15 | Discharge: 2020-10-15 | Disposition: A | Discharge status: Home / Self Care | Payer: Self-pay | Source: Ambulatory Visit | Attending: Obstetrics & Gynecology | Admitting: Obstetrics & Gynecology

## 2020-10-15 ENCOUNTER — Ambulatory Visit (INDEPENDENT_AMBULATORY_CARE_PROVIDER_SITE_OTHER): Payer: Self-pay | Admitting: Obstetrics & Gynecology

## 2020-10-15 VITALS — BP 114/74 | HR 92 | Wt 218.0 lb

## 2020-10-15 DIAGNOSIS — O99210 Obesity complicating pregnancy, unspecified trimester: Secondary | ICD-10-CM

## 2020-10-15 DIAGNOSIS — O0990 Supervision of high risk pregnancy, unspecified, unspecified trimester: Secondary | ICD-10-CM | POA: Insufficient documentation

## 2020-10-15 DIAGNOSIS — O3663X0 Maternal care for excessive fetal growth, third trimester, not applicable or unspecified: Secondary | ICD-10-CM

## 2020-10-15 LAB — OB RESULTS CONSOLE GC/CHLAMYDIA: Gonorrhea: NEGATIVE

## 2020-10-15 NOTE — Progress Notes (Signed)
   PRENATAL VISIT NOTE  Subjective:  Carolyn Gomez is a 30 y.o. [phone] at [redacted]w[redacted]d being seen today for ongoing prenatal care.  She is currently monitored for the following issues for this high-risk pregnancy and has Obesity in pregnancy; Postpartum depression; History of preterm delivery, currently pregnant; Short interval between pregnancies affecting pregnancy, antepartum; Language barrier; Late prenatal care affecting pregnancy, antepartum; Supervision of high risk pregnancy, antepartum; LGA (large for gestational age) fetus affecting management of mother; Anemia affecting pregnancy; History of postpartum hemorrhage; and BMI 39.0-39.9,adult on their problem list.  Patient reports occasional contractions and mild rash and itch arms and abdomen.  Contractions: Irregular. Vag. Bleeding: None.  Movement: Present. Denies leaking of fluid.   The following portions of the patient's history were reviewed and updated as appropriate: allergies, current medications, past family history, past medical history, past social history, past surgical history and problem list.   Objective:   Vitals:   10/15/20 0922  BP: 114/74  Pulse: 92  Weight: 218 lb (98.9 kg)    Fetal Status: Fetal Heart Rate (bpm): 151 Fundal Height: 39 cm Movement: Present  Presentation: Vertex  General:  Alert, oriented and cooperative. Patient is in no acute distress.  Skin: Skin is warm and dry. No rash noted.   Cardiovascular: Normal heart rate noted  Respiratory: Normal respiratory effort, no problems with respiration noted  Abdomen: Soft, gravid, appropriate for gestational age.  Pain/Pressure: Absent     Pelvic: Cervical exam performed in the presence of a chaperone Dilation: 2.5 Effacement (%): 40 Station: -3  Extremities: Normal range of motion.  Edema: None  Mental Status: Normal mood and affect. Normal behavior. Normal judgment and thought content.   Assessment and Plan:  Pregnancy: G3P1102 at [redacted]w[redacted]d 1. Obesity  in pregnancy Body mass index is 41.19 kg/m.   2. Supervision of high risk pregnancy, antepartum Routine  - Culture, beta strep (group b only) - Cervicovaginal ancillary only( [place]) - Bile acids, total  3. Excessive fetal growth affecting management of pregnancy in third trimester, single or unspecified fetus F/u US. Itch is likely due to PUPPP but will do bile acid serum test  Preterm labor symptoms and general obstetric precautions including but not limited to vaginal bleeding, contractions, leaking of fluid and fetal movement were reviewed in detail with the patient. Please refer to After Visit Summary for other counseling recommendations.   Return in about 1 week (around 10/22/2020).  Future Appointments  Date Time Provider Department Center  10/16/2020  8:00 AM [facility] None  10/21/2020  2:30 PM [facility] NURSE [facility]  10/21/2020  2:45 PM [facility] US5 [facility]    Scheryl Darter, MD

## 2020-10-15 NOTE — Progress Notes (Signed)
Patient stated that she has been having a constant itch on both arms that started last week along with acne on abdomen that started last week as well.  2 contraction per hour that are "30 minutes or so" apart

## 2020-10-16 ENCOUNTER — Encounter (HOSPITAL_COMMUNITY)
Admission: RE | Admit: 2020-10-16 | Discharge: 2020-10-16 | Disposition: A | Discharge status: Home / Self Care | Payer: Medicaid Other | Source: Ambulatory Visit | Attending: Obstetrics and Gynecology | Admitting: Obstetrics and Gynecology

## 2020-10-16 DIAGNOSIS — Z3A36 36 weeks gestation of pregnancy: Secondary | ICD-10-CM | POA: Diagnosis not present

## 2020-10-16 DIAGNOSIS — O99013 Anemia complicating pregnancy, third trimester: Secondary | ICD-10-CM | POA: Insufficient documentation

## 2020-10-16 LAB — CERVICOVAGINAL ANCILLARY ONLY
Chlamydia: NEGATIVE
Comment: NEGATIVE
Comment: NORMAL
Neisseria Gonorrhea: NEGATIVE

## 2020-10-16 LAB — BILE ACIDS, TOTAL: Bile Acids Total: 12.8 umol/L (ref 0.0–10.0)

## 2020-10-16 MED ORDER — SODIUM CHLORIDE 0.9 % IV SOLN
510.0000 mg | INTRAVENOUS | Status: DC
  Administered 2020-10-16: 510 mg via INTRAVENOUS
  Filled 2020-10-16: qty 510

## 2020-10-17 ENCOUNTER — Inpatient Hospital Stay (HOSPITAL_COMMUNITY)
Admission: AD | Admit: 2020-10-17 | Discharge: 2020-10-17 | Disposition: A | Discharge status: Home / Self Care | Payer: Self-pay | Attending: Obstetrics & Gynecology | Admitting: Obstetrics & Gynecology

## 2020-10-17 ENCOUNTER — Other Ambulatory Visit: Payer: Self-pay

## 2020-10-17 ENCOUNTER — Encounter (HOSPITAL_COMMUNITY): Payer: Self-pay | Admitting: Obstetrics & Gynecology

## 2020-10-17 DIAGNOSIS — Z3689 Encounter for other specified antenatal screening: Secondary | ICD-10-CM

## 2020-10-17 DIAGNOSIS — Z0371 Encounter for suspected problem with amniotic cavity and membrane ruled out: Secondary | ICD-10-CM

## 2020-10-17 DIAGNOSIS — O26893 Other specified pregnancy related conditions, third trimester: Secondary | ICD-10-CM | POA: Diagnosis present

## 2020-10-17 DIAGNOSIS — Z3A36 36 weeks gestation of pregnancy: Secondary | ICD-10-CM | POA: Insufficient documentation

## 2020-10-17 DIAGNOSIS — K831 Obstruction of bile duct: Secondary | ICD-10-CM

## 2020-10-17 DIAGNOSIS — O26613 Liver and biliary tract disorders in pregnancy, third trimester: Secondary | ICD-10-CM

## 2020-10-17 LAB — WET PREP, GENITAL
Sperm: NONE SEEN
Trich, Wet Prep: NONE SEEN
Yeast Wet Prep HPF POC: NONE SEEN

## 2020-10-17 LAB — POCT FERN TEST

## 2020-10-17 MED ORDER — URSODIOL 300 MG PO CAPS
300.0000 mg | ORAL_CAPSULE | Freq: Once | ORAL | Status: AC
  Administered 2020-10-17: 300 mg via ORAL
  Filled 2020-10-17: qty 1

## 2020-10-17 MED ORDER — URSODIOL 500 MG PO TABS: 500.0000 mg | ORAL_TABLET | Freq: Two times a day (BID) | ORAL | 0 refills | Status: DC

## 2020-10-17 NOTE — MAU Provider Note (Signed)
S: Carolyn Gomez is a 30 y.o. [phone] at [redacted]w[redacted]d  who presents to MAU today complaining of leaking of fluid since 1600. She denies vaginal bleeding. She endorses contractions. She reports normal fetal movement.    O: BP 128/67 (BP Location: Right Arm)   Temp 99.4 F (37.4 C) (Oral)   Resp 19   Ht 5\' 2"  (1.575 m)   Wt 98.9 kg   LMP 02/11/2020 (Approximate)   SpO2 100%   BMI 39.87 kg/m  GENERAL: Well-developed, well-nourished female in no acute distress.  HEAD: Normocephalic, atraumatic.  CHEST: Normal effort of breathing, regular heart rate ABDOMEN: Soft, nontender, gravid PELVIC: Normal external female genitalia. Vagina is pink and rugated. Cervix with normal contour, no lesions. Normal discharge.  Negative pooling. Fern Collected.  Cervical exam:   Deferred   Fetal Monitoring: FHT: 135 bpm, Mod Var, -Decels, +Accels Toco: Irregular Contractions  Results for orders placed or performed during the hospital encounter of 10/17/20 (from the past 24 hour(s))  Fern Test     Status: Normal   Collection Time: 10/17/20  9:25 PM  Result Value Ref Range   POCT Fern Test       A: SIUP at [redacted]w[redacted]d  Membranes intact ICP NST Reactive  P: -Fern Negative -Discussed diagnosis of ICP and need for induction. -Reviewed cause, treatment, and recommendations r/t ICP. -Addressed questions.  -Will give one dose Actigall now.  [redacted]w[redacted]d, CNM 10/17/2020 9:39 PM  Reassessment (10:49 PM)  -Actigall given.  -Patient scheduled for IOL on Tueday Oct 18th at [facility]. -Informed of need to take actigall BID until admission. -Rx sent to pharmacy on file.  -Encouraged to call primary office or return to MAU if symptoms worsen or with the onset of new symptoms. -Discharged to home in stable condition.  [facility] MSN, CNM Advanced Practice Provider, Center for [facility]

## 2020-10-17 NOTE — MAU Note (Signed)
Leaking of fluid since 6pm, abdominal cramping afterward. Denied bleeding. Endorses positive FM, No complicated with prenatal care

## 2020-10-18 ENCOUNTER — Other Ambulatory Visit: Payer: Self-pay | Admitting: Family Medicine

## 2020-10-18 LAB — CULTURE, BETA STREP (GROUP B ONLY): Strep Gp B Culture: NEGATIVE

## 2020-10-19 ENCOUNTER — Telehealth: Payer: Self-pay | Admitting: *Deleted

## 2020-10-19 ENCOUNTER — Encounter: Payer: Self-pay | Admitting: *Deleted

## 2020-10-19 ENCOUNTER — Other Ambulatory Visit (HOSPITAL_COMMUNITY): Payer: Self-pay | Admitting: Advanced Practice Midwife

## 2020-10-19 DIAGNOSIS — K831 Obstruction of bile duct: Secondary | ICD-10-CM | POA: Insufficient documentation

## 2020-10-19 DIAGNOSIS — O26619 Liver and biliary tract disorders in pregnancy, unspecified trimester: Secondary | ICD-10-CM | POA: Insufficient documentation

## 2020-10-19 NOTE — Telephone Encounter (Signed)
Received voicemessage from [facility] from this am. I called and they reported elevated bile labs drawn 10/15/20. Per chart review results noted during MAU visit 10/17/20 and patient started on actigall and IOL scheduled per protocol for 10/20/20.  Zae Kirtz,RN

## 2020-10-20 ENCOUNTER — Encounter (HOSPITAL_COMMUNITY): Payer: Self-pay | Admitting: Obstetrics & Gynecology

## 2020-10-20 ENCOUNTER — Inpatient Hospital Stay (HOSPITAL_COMMUNITY): Payer: Medicaid Other | Admitting: Anesthesiology

## 2020-10-20 ENCOUNTER — Other Ambulatory Visit: Payer: Self-pay

## 2020-10-20 ENCOUNTER — Inpatient Hospital Stay (HOSPITAL_COMMUNITY): Payer: Medicaid Other

## 2020-10-20 ENCOUNTER — Inpatient Hospital Stay (HOSPITAL_COMMUNITY)
Admission: EM | Admit: 2020-10-20 | Discharge: 2020-10-21 | DRG: 805 | Disposition: A | Discharge status: Home / Self Care | Payer: Medicaid Other | Attending: Family Medicine | Admitting: Family Medicine

## 2020-10-20 DIAGNOSIS — K831 Obstruction of bile duct: Secondary | ICD-10-CM | POA: Diagnosis present

## 2020-10-20 DIAGNOSIS — Z20822 Contact with and (suspected) exposure to covid-19: Secondary | ICD-10-CM | POA: Diagnosis present

## 2020-10-20 DIAGNOSIS — O9902 Anemia complicating childbirth: Secondary | ICD-10-CM | POA: Diagnosis present

## 2020-10-20 DIAGNOSIS — O3660X0 Maternal care for excessive fetal growth, unspecified trimester, not applicable or unspecified: Secondary | ICD-10-CM | POA: Diagnosis present

## 2020-10-20 DIAGNOSIS — O3663X0 Maternal care for excessive fetal growth, third trimester, not applicable or unspecified: Secondary | ICD-10-CM | POA: Diagnosis present

## 2020-10-20 DIAGNOSIS — O26619 Liver and biliary tract disorders in pregnancy, unspecified trimester: Secondary | ICD-10-CM | POA: Diagnosis present

## 2020-10-20 DIAGNOSIS — Z3490 Encounter for supervision of normal pregnancy, unspecified, unspecified trimester: Secondary | ICD-10-CM | POA: Diagnosis present

## 2020-10-20 DIAGNOSIS — O09899 Supervision of other high risk pregnancies, unspecified trimester: Secondary | ICD-10-CM

## 2020-10-20 DIAGNOSIS — Z789 Other specified health status: Secondary | ICD-10-CM | POA: Diagnosis present

## 2020-10-20 DIAGNOSIS — Z3A37 37 weeks gestation of pregnancy: Secondary | ICD-10-CM | POA: Diagnosis not present

## 2020-10-20 DIAGNOSIS — O99013 Anemia complicating pregnancy, third trimester: Secondary | ICD-10-CM

## 2020-10-20 DIAGNOSIS — O2662 Liver and biliary tract disorders in childbirth: Principal | ICD-10-CM | POA: Diagnosis present

## 2020-10-20 DIAGNOSIS — Z8759 Personal history of other complications of pregnancy, childbirth and the puerperium: Secondary | ICD-10-CM

## 2020-10-20 DIAGNOSIS — O99019 Anemia complicating pregnancy, unspecified trimester: Secondary | ICD-10-CM | POA: Diagnosis present

## 2020-10-20 DIAGNOSIS — O0930 Supervision of pregnancy with insufficient antenatal care, unspecified trimester: Secondary | ICD-10-CM

## 2020-10-20 LAB — CBC
HCT: 32 % — ABNORMAL LOW (ref 36.0–46.0)
Hemoglobin: 9.8 g/dL — ABNORMAL LOW (ref 12.0–15.0)
MCH: 24.2 pg — ABNORMAL LOW (ref 26.0–34.0)
MCHC: 30.6 g/dL (ref 30.0–36.0)
MCV: 79 fL — ABNORMAL LOW (ref 80.0–100.0)
Platelets: 415 10*3/uL — ABNORMAL HIGH (ref 150–400)
RBC: 4.05 MIL/uL (ref 3.87–5.11)
RDW: 21.9 % — ABNORMAL HIGH (ref 11.5–15.5)
WBC: 10.3 10*3/uL (ref 4.0–10.5)
nRBC: 0 % (ref 0.0–0.2)

## 2020-10-20 LAB — RESP PANEL BY RT-PCR (FLU A&B, COVID) ARPGX2
Influenza A by PCR: NEGATIVE
Influenza B by PCR: NEGATIVE
SARS Coronavirus 2 by RT PCR: NEGATIVE

## 2020-10-20 LAB — RPR: RPR Ser Ql: NONREACTIVE

## 2020-10-20 LAB — TYPE AND SCREEN
ABO/RH(D): O POS
Antibody Screen: NEGATIVE

## 2020-10-20 MED ORDER — URSODIOL 300 MG PO CAPS
300.0000 mg | ORAL_CAPSULE | Freq: Two times a day (BID) | ORAL | Status: DC
  Administered 2020-10-20: 300 mg via ORAL
  Filled 2020-10-20 (×2): qty 1

## 2020-10-20 MED ORDER — ACETAMINOPHEN 325 MG PO TABS
650.0000 mg | ORAL_TABLET | ORAL | Status: DC | PRN
  Administered 2020-10-20: 650 mg via ORAL
  Filled 2020-10-20: qty 2

## 2020-10-20 MED ORDER — IBUPROFEN 600 MG PO TABS
600.0000 mg | ORAL_TABLET | Freq: Once | ORAL | Status: AC
  Administered 2020-10-20: 600 mg via ORAL
  Filled 2020-10-20: qty 1

## 2020-10-20 MED ORDER — PHENYLEPHRINE 40 MCG/ML (10ML) SYRINGE FOR IV PUSH (FOR BLOOD PRESSURE SUPPORT): 80.0000 ug | PREFILLED_SYRINGE | INTRAVENOUS | Status: DC | PRN

## 2020-10-20 MED ORDER — LIDOCAINE HCL (PF) 1 % IJ SOLN: 30.0000 mL | INTRAMUSCULAR | Status: DC | PRN

## 2020-10-20 MED ORDER — OXYCODONE-ACETAMINOPHEN 5-325 MG PO TABS: 2.0000 | ORAL_TABLET | ORAL | Status: DC | PRN

## 2020-10-20 MED ORDER — BENZOCAINE-MENTHOL 20-0.5 % EX AERO
1.0000 "application " | INHALATION_SPRAY | CUTANEOUS | Status: DC | PRN
  Filled 2020-10-20: qty 56

## 2020-10-20 MED ORDER — EPHEDRINE 5 MG/ML INJ
10.0000 mg | INTRAVENOUS | Status: DC | PRN
Start: 2020-10-20 — End: 2020-10-20

## 2020-10-20 MED ORDER — HYDROXYZINE HCL 50 MG PO TABS
25.0000 mg | ORAL_TABLET | Freq: Three times a day (TID) | ORAL | Status: DC | PRN
  Administered 2020-10-20: 25 mg via ORAL
  Filled 2020-10-20: qty 1

## 2020-10-20 MED ORDER — FENTANYL-BUPIVACAINE-NACL 0.5-0.125-0.9 MG/250ML-% EP SOLN
12.0000 mL/h | EPIDURAL | Status: DC | PRN
  Administered 2020-10-20: 12 mL/h via EPIDURAL
  Filled 2020-10-20: qty 250

## 2020-10-20 MED ORDER — FENTANYL CITRATE (PF) 100 MCG/2ML IJ SOLN
INTRAMUSCULAR | Status: AC
  Filled 2020-10-20: qty 2

## 2020-10-20 MED ORDER — PRENATAL MULTIVITAMIN CH
1.0000 | ORAL_TABLET | Freq: Every day | ORAL | Status: DC
  Administered 2020-10-21: 1 via ORAL
  Filled 2020-10-20: qty 1

## 2020-10-20 MED ORDER — OXYCODONE-ACETAMINOPHEN 5-325 MG PO TABS: 1.0000 | ORAL_TABLET | ORAL | Status: DC | PRN

## 2020-10-20 MED ORDER — COCONUT OIL OIL: 1.0000 "application " | TOPICAL_OIL | Status: DC | PRN

## 2020-10-20 MED ORDER — OXYTOCIN BOLUS FROM INFUSION
333.0000 mL | Freq: Once | INTRAVENOUS | Status: AC
  Administered 2020-10-20: 333 mL via INTRAVENOUS

## 2020-10-20 MED ORDER — TRANEXAMIC ACID-NACL 1000-0.7 MG/100ML-% IV SOLN
INTRAVENOUS | Status: AC
  Administered 2020-10-20: 1000 mg via INTRAVENOUS
  Filled 2020-10-20: qty 100

## 2020-10-20 MED ORDER — ONDANSETRON HCL 4 MG/2ML IJ SOLN: 4.0000 mg | INTRAMUSCULAR | Status: DC | PRN

## 2020-10-20 MED ORDER — IBUPROFEN 600 MG PO TABS
600.0000 mg | ORAL_TABLET | Freq: Four times a day (QID) | ORAL | Status: DC
  Administered 2020-10-20 – 2020-10-21 (×5): 600 mg via ORAL
  Filled 2020-10-20 (×5): qty 1

## 2020-10-20 MED ORDER — WITCH HAZEL-GLYCERIN EX PADS: 1.0000 "application " | MEDICATED_PAD | CUTANEOUS | Status: DC | PRN

## 2020-10-20 MED ORDER — SOD CITRATE-CITRIC ACID 500-334 MG/5ML PO SOLN: 30.0000 mL | ORAL | Status: DC | PRN

## 2020-10-20 MED ORDER — DIPHENHYDRAMINE HCL 25 MG PO CAPS: 25.0000 mg | ORAL_CAPSULE | Freq: Four times a day (QID) | ORAL | Status: DC | PRN

## 2020-10-20 MED ORDER — MISOPROSTOL 25 MCG QUARTER TABLET
25.0000 ug | ORAL_TABLET | ORAL | Status: DC | PRN
  Administered 2020-10-20: 25 ug via VAGINAL
  Filled 2020-10-20: qty 1

## 2020-10-20 MED ORDER — LACTATED RINGERS IV SOLN
500.0000 mL | Freq: Once | INTRAVENOUS | Status: AC
  Administered 2020-10-20: 500 mL via INTRAVENOUS

## 2020-10-20 MED ORDER — SIMETHICONE 80 MG PO CHEW: 80.0000 mg | CHEWABLE_TABLET | ORAL | Status: DC | PRN

## 2020-10-20 MED ORDER — FERROUS SULFATE 325 (65 FE) MG PO TABS
325.0000 mg | ORAL_TABLET | ORAL | Status: DC
  Administered 2020-10-20: 325 mg via ORAL
  Filled 2020-10-20: qty 1

## 2020-10-20 MED ORDER — FENTANYL CITRATE (PF) 100 MCG/2ML IJ SOLN
100.0000 ug | INTRAMUSCULAR | Status: DC | PRN
  Administered 2020-10-20: 100 ug via INTRAVENOUS

## 2020-10-20 MED ORDER — ACETAMINOPHEN 325 MG PO TABS
650.0000 mg | ORAL_TABLET | ORAL | Status: DC | PRN
  Administered 2020-10-20 – 2020-10-21 (×2): 650 mg via ORAL
  Filled 2020-10-20 (×2): qty 2

## 2020-10-20 MED ORDER — TRANEXAMIC ACID-NACL 1000-0.7 MG/100ML-% IV SOLN: 1000.0000 mg | INTRAVENOUS | Status: AC

## 2020-10-20 MED ORDER — ONDANSETRON HCL 4 MG PO TABS: 4.0000 mg | ORAL_TABLET | ORAL | Status: DC | PRN

## 2020-10-20 MED ORDER — TERBUTALINE SULFATE 1 MG/ML IJ SOLN: 0.2500 mg | Freq: Once | INTRAMUSCULAR | Status: DC | PRN

## 2020-10-20 MED ORDER — SENNOSIDES-DOCUSATE SODIUM 8.6-50 MG PO TABS
2.0000 | ORAL_TABLET | Freq: Every day | ORAL | Status: DC
  Administered 2020-10-21: 2 via ORAL
  Filled 2020-10-20: qty 2

## 2020-10-20 MED ORDER — LACTATED RINGERS IV SOLN
INTRAVENOUS | Status: DC
  Administered 2020-10-20: 1000 mL via INTRAVENOUS

## 2020-10-20 MED ORDER — LACTATED RINGERS IV SOLN
500.0000 mL | INTRAVENOUS | Status: DC | PRN
  Administered 2020-10-20: 500 mL via INTRAVENOUS

## 2020-10-20 MED ORDER — EPHEDRINE 5 MG/ML INJ: 10.0000 mg | INTRAVENOUS | Status: DC | PRN

## 2020-10-20 MED ORDER — TETANUS-DIPHTH-ACELL PERTUSSIS 5-2.5-18.5 LF-MCG/0.5 IM SUSY: 0.5000 mL | PREFILLED_SYRINGE | Freq: Once | INTRAMUSCULAR | Status: DC

## 2020-10-20 MED ORDER — ONDANSETRON HCL 4 MG/2ML IJ SOLN
4.0000 mg | Freq: Four times a day (QID) | INTRAMUSCULAR | Status: DC | PRN
Start: 2020-10-20 — End: 2020-10-20
  Administered 2020-10-20: 4 mg via INTRAVENOUS
  Filled 2020-10-20: qty 2

## 2020-10-20 MED ORDER — DIBUCAINE (PERIANAL) 1 % EX OINT: 1.0000 "application " | TOPICAL_OINTMENT | CUTANEOUS | Status: DC | PRN

## 2020-10-20 MED ORDER — OXYTOCIN-SODIUM CHLORIDE 30-0.9 UT/500ML-% IV SOLN
2.5000 [IU]/h | INTRAVENOUS | Status: DC
  Administered 2020-10-20: 2.5 [IU]/h via INTRAVENOUS
  Filled 2020-10-20: qty 500

## 2020-10-20 MED ORDER — LIDOCAINE HCL (PF) 1 % IJ SOLN
INTRAMUSCULAR | Status: DC | PRN
  Administered 2020-10-20: 10 mL via EPIDURAL

## 2020-10-20 MED ORDER — DIPHENHYDRAMINE HCL 50 MG/ML IJ SOLN: 12.5000 mg | INTRAMUSCULAR | Status: DC | PRN

## 2020-10-20 NOTE — Progress Notes (Signed)
Patient ID: Carolyn Gomez, female   DOB: 11-Feb-1990, 30 y.o.   MRN: [id number]  Not feeling urge to push; comfortable w epidural  BP 128/85, P 84 FHR 130s, +accels, no decels Ctx q 2-3 mins Cx C/C/vtx -1  IUP@37 .1wks ICP End 1st stage  -Will labor down for a bit until she feels the urge to push -Anticipate vag del  Carolyn Gomez [facility] 10/20/2020 11:37 AM

## 2020-10-20 NOTE — H&P (Addendum)
Carolyn Gomez is a 30 y.o. female [phone] with IUP at 17w1dby 6wk [place] for IOL for intrahepatic cholestasis of pregnancy. She reports +FMs, No LOF, some spotting but no frank VB. no blurry vision, headaches or peripheral edema, and RUQ pain.  She plans on breast and bottle feeding. She request OCP's for birth control. [place]: [place] 6w [place]--->  Estimated Date of Delivery: 11/09/20  Sono:    @[redacted]w[redacted]d , CWD, normal anatomy, cephalic presentation, posterior placenta, 1931g, 92% EFW   Prenatal History/Complications: ICP, late to care, short interval pregnancy, LGA, anemia, Hx PPH, Hx preterm delivery  Past Medical History: Past Medical History:  Diagnosis Date   Medical history non-contributory     Past Surgical History: Past Surgical History:  Procedure Laterality Date   NO PAST SURGERIES      Obstetrical History: OB History     Gravida  3   Para  2   Term  1   Preterm  1   AB  0   Living  2      SAB  0   IAB  0   Ectopic  0   Multiple  0   Live Births  1        Obstetric Comments  Per patient, patient had anemia in last pregnancy and required transfusion after delivery. She reports previously her anemia was 'severe' and required 1 unit pRBC postpartum.   Postpartum she reports she had some heavy bleeding but this resolved.          Social History Social History   Socioeconomic History   Marital status: Single    Spouse name: Not on file   Number of children: Not on file   Years of education: Not on file   Highest education level: Not on file  Occupational History   Not on file  Tobacco Use   Smoking status: Never   Smokeless tobacco: Never  Vaping Use   Vaping Use: Never used  Substance and Sexual Activity   Alcohol use: Never    Comment: occasionally drink a beer   Drug use: Never   Sexual activity: Yes    Birth control/protection:  Injection  Other Topics Concern   Not on file  Social History Narrative   Not on file   Social Determinants of Health   Financial Resource Strain: Not on file  Food Insecurity: No Food Insecurity   Worried About Running Out of Food in the Last Year: Never true   [place] the Last Year: Never true  Transportation Needs: No Transportation Needs   Lack of Transportation (Medical): No   Lack of Transportation (Non-Medical): No  Physical Activity: Not on file  Stress: Not on file  Social Connections: Not on file    Family History: Family History  Problem Relation Age of Onset   Cancer Neg Hx    Diabetes Neg Hx    Hyperlipidemia Neg Hx    Hypertension Neg Hx     Allergies: No Known Allergies  Medications Prior to Admission  Medication Sig Dispense Refill Last Dose   docusate sodium (COLACE) 100 MG capsule Take 1 capsule (100 mg total) by mouth 2 (two) times daily as needed. (Patient not taking: Reported on 10/15/2020) 30 capsule 2    ferrous sulfate 325 (65 FE) MG tablet Take 1 tablet (325 mg total) by mouth daily. (Patient not  taking: Reported on 10/15/2020) 30 tablet 3    hydrOXYzine (VISTARIL) 25 MG capsule Take 1 capsule (25 mg total) by mouth 3 (three) times daily as needed (anxiety). (Patient not taking: No sig reported) 30 capsule 0    Prenatal Vit-Fe Fumarate-FA (MULTIVITAMIN-PRENATAL) 27-0.8 MG TABS tablet Take 1 tablet by mouth daily at 12 noon.      ursodiol (ACTIGALL) 500 MG tablet Take 1 tablet (500 mg total) by mouth 2 (two) times daily for 2 days. 6 tablet 0      Review of Systems   All systems reviewed and negative except as stated in HPI  Last menstrual period 02/11/2020, currently breastfeeding. General appearance: alert, cooperative, and no distress Lungs: Normal work of breathing Heart: regular rate and rhythm Abdomen: gravid Pelvic: cervical exam below Extremities: no swelling, no sign of DVT Presentation: cephalic Fetal  monitoringBaseline: 130 bpm, Variability: Good {> 6 bpm), Accelerations: Reactive, and Decelerations: Absent Uterine activityFrequency: Every 3-5 minutes     Prenatal labs: ABO, Rh: O/Positive/-- (07/14 1555) Antibody: Negative (07/14 1555) Rubella: 10.40 (07/14 1555) RPR: Non Reactive (08/04 0857)  HBsAg: Negative (07/14 1555)  HIV: Non Reactive (08/04 0857)  GBS: Negative/-- (10/13 1003)  2 hr Glucola passed Genetic screening  not performed due to late to care Anatomy US normal  Prenatal Transfer Tool  Maternal Diabetes: No Genetic Screening: not performed Maternal Ultrasounds/Referrals: Normal Fetal Ultrasounds or other Referrals:  none Maternal Substance Abuse:  No Significant Maternal Medications:  ursodiol, hydroxyzine prn Significant Maternal Lab Results: Group B Strep negative  No results found for this or any previous visit (from the past 24 hour(s)).  Patient Active Problem List   Diagnosis Date Noted   Cholestasis during pregnancy 10/19/2020   History of postpartum hemorrhage 09/16/2020   BMI 39.0-39.9,adult 09/16/2020   Anemia affecting pregnancy 08/10/2020   LGA (large for gestational age) fetus affecting management of mother 08/06/2020   Supervision of high risk pregnancy, antepartum 07/19/2020   History of preterm delivery, currently pregnant 07/16/2020   Short interval between pregnancies affecting pregnancy, antepartum 07/16/2020   Language barrier 07/16/2020   Late prenatal care affecting pregnancy, antepartum 07/16/2020   Postpartum depression 09/11/2019   Obesity in pregnancy 06/13/2019    Assessment/Plan:  Carolyn Gomez is a 30 y.o. [id number] at 103w1dhere for IOL for ICP  #Labor: In early labor with regular painful contractions. Cytotec 25 given vaginally for cervical ripening. Check in 4 hours, consider pitocin if needed #ICP: continue home ursodiol and atarax prn for itching #Anemia: HGB 9.8 on admission, improved from 8.8 on  9/14. #Pain: Desires epidural #FWB: Cat 1 #ID:  GBS neg #MOF: breast and bottle #MOC: OCP #Circ:  declined  ALattie Corns MD, PGY-1, Faculty Service 10/20/2020, 3:14 AM   I personally saw and evaluated the patient, performing the key elements of the service. I developed and verified the management plan that is described in the resident's/student's note, and I agree with the content with my edits above. VSS, HRR&R, Resp unlabored, Legs neg.  FNigel Berthold CNM 10/20/2020 7:54 AM

## 2020-10-20 NOTE — Lactation Note (Signed)
This note was copied from a baby's chart. Lactation Consultation Note  Patient Name: Carolyn Gomez Today's Date: 10/20/2020 Reason for consult: Initial assessment;Early term 37-38.6wks Age:31 hours  Initial visit to 5 hours old infant of a P3 mother. Mother is experienced, hand expresses and latches independently. Observed a deep latch, good alignment and positioning. Mother requests hand pump, LC brought it to room. Discussed normal newborn behavior and patterns, signs of good milk transfer, hunger cues, tummy size and benefits of skin to skin.  Mother has formula sitting in room. Reviewed feeding volume per age.  Plan: 1-Deep, comfortable latch and breastfeeding on demand or 8-12 times in 24h period. 2-Encouraged maternal rest, hydration and food intake.  3-Contact LC as needed for feeds/support/concerns/questions   All questions answered at this time. Provided Lactation services brochure and promoted [employer] booklet information.     Maternal Data Has patient been taught Hand Expression?: Yes Does the patient have breastfeeding experience prior to this delivery?: Yes How long did the patient breastfeed?: 3 years and 1 year  Feeding Mother's Current Feeding Choice: Breast Milk and Formula  LATCH Score Latch: Grasps breast easily, tongue down, lips flanged, rhythmical sucking.  Audible Swallowing: Spontaneous and intermittent  Type of Nipple: Everted at rest and after stimulation  Comfort (Breast/Nipple): Soft / non-tender  Hold (Positioning): No assistance needed to correctly position infant at breast.  LATCH Score: 10   Lactation Tools Discussed/Used Tools: Pump;Flanges Flange Size: 24 Breast pump type: Manual Pump Education: Milk Storage;Setup, frequency, and cleaning Reason for Pumping: mother's request Pumping frequency: as needed  Interventions Interventions: Breast feeding basics reviewed;Assisted with latch;Skin to skin;Hand express;Breast  massage;Breast compression;Hand pump;Expressed milk;Support pillows;Education;LC Services brochure  Discharge Pump: Manual WIC Program: Yes  Consult Status Consult Status: Follow-up Date: 10/21/20 Follow-up type: In-patient    Reinhart Saulters A Higuera Ancidey 10/20/2020, 6:28 PM

## 2020-10-20 NOTE — Progress Notes (Signed)
LABOR PROGRESS NOTE  Carolyn Gomez is a 30 y.o. [phone] at [redacted]w[redacted]d  presented for IOL for intrahepatic cholestasis of pregnancy (ICP).  Subjective: Pt is comfortable and not feeling any pain with epidural. She does feel contractions and pressure when they come on.  Objective: BP 115/77   Pulse 85   Temp 98.1 F (36.7 C) (Oral)   Resp 18   Ht 5\' 2"  (1.575 m)   Wt 100.2 kg   LMP 02/11/2020 (Approximate)   SpO2 100%   BMI 40.40 kg/m  or  Vitals:   10/20/20 0800 10/20/20 0830 10/20/20 0903 10/20/20 0931  BP: 129/89 118/90 125/81 115/77  Pulse: 89 86 84 85  Resp:      Temp:      TempSrc:      SpO2:      Weight:      Height:       Dilation: 7 Effacement (%): 70 Cervical Position: Posterior Station: -3 Presentation: Vertex Exam by:: [email or web address], Exam by myself FHT: baseline rate 130, moderate variability, -accels, early and variable decels Toco: q3-64min  Labs: Lab Results  Component Value Date   WBC 10.3 10/20/2020   HGB 9.8 (L) 10/20/2020   HCT 32.0 (L) 10/20/2020   MCV 79.0 (L) 10/20/2020   PLT 415 (H) 10/20/2020    Patient Active Problem List   Diagnosis Date Noted   Encounter for induction of labor 10/20/2020   Cholestasis during pregnancy 10/19/2020   History of postpartum hemorrhage 09/16/2020   BMI 39.0-39.9,adult 09/16/2020   Anemia affecting pregnancy 08/10/2020   LGA (large for gestational age) fetus affecting management of mother 08/06/2020   Supervision of high risk pregnancy, antepartum 07/19/2020   History of preterm delivery, currently pregnant 07/16/2020   Short interval between pregnancies affecting pregnancy, antepartum 07/16/2020   Language barrier 07/16/2020   Late prenatal care affecting pregnancy, antepartum 07/16/2020   Postpartum depression 09/11/2019   Obesity in pregnancy 06/13/2019    Assessment / Plan: 30 y.o. 38 at [redacted]w[redacted]d here for IOL for ICP.  Labor: Progressing well. AROM'd successfully with clear fluid.  Fetal  Wellbeing:  Category II due to presence of variable decels. Continue to monitor. Pain Control:  Epidural Anticipated MOD:  Vaginal #GBS negative #ICP: usrodiol and hydroxyzine prn  [redacted]w[redacted]d, DO 10/20/2020, 10:00 AM PGY-1, [facility]

## 2020-10-20 NOTE — Progress Notes (Signed)
In house interpreter used to do admission teaching. Patient told to not get up without RN. Patient told about baby safety, bulb suction and call bell for emergency.

## 2020-10-20 NOTE — Anesthesia Procedure Notes (Signed)
Epidural Patient location during procedure: OB Start time: 10/20/2020 5:29 AM End time: 10/20/2020 5:39 AM  Staffing Anesthesiologist: Lucretia Kern, MD Performed: anesthesiologist   Preanesthetic Checklist Completed: patient identified, IV checked, risks and benefits discussed, monitors and equipment checked, pre-op evaluation and timeout performed  Epidural Patient position: sitting Prep: DuraPrep Patient monitoring: heart rate, continuous pulse ox and blood pressure Approach: midline Location: L3-L4 Injection technique: LOR air  Needle:  Needle type: Tuohy  Needle gauge: 17 G Needle length: 9 cm Needle insertion depth: 6.5 cm Catheter type: closed end flexible Catheter size: 19 Gauge Catheter at skin depth: 11.5 cm Test dose: negative  Assessment Events: blood not aspirated, injection not painful, no injection resistance, no paresthesia and negative IV test  Additional Notes Reason for block:procedure for pain

## 2020-10-20 NOTE — Lactation Note (Signed)
This note was copied from a baby's chart. Lactation Consultation Note  Patient Name: Carolyn Gomez EYEMV'V Date: 10/20/2020 Reason for consult: L&D Initial assessment Age:31 hours P3 Mother speaks Spanish. Husband at the bedside. He interprets  for her . Mother reports that she breastfed her first child for 2 yrs and her last child for 1 yrs.  Mother had infant latched in cradle hold when I arrived in the room. Observed good suckling and swallowing pattern.   Advised to feed infant with all feeding cues. Informed mother that if she needed assistance she would had a LC and RN at the bedside when she gets to her room.   Maternal Data Has patient been taught Hand Expression?: Yes Does the patient have breastfeeding experience prior to this delivery?: Yes How long did the patient breastfeed?: 2 yrs, 1 yr  Feeding Mother's Current Feeding Choice: Breast Milk and Formula  LATCH Score Latch: Grasps breast easily, tongue down, lips flanged, rhythmical sucking.  Audible Swallowing: Spontaneous and intermittent  Type of Nipple: Everted at rest and after stimulation  Comfort (Breast/Nipple): Soft / non-tender  Hold (Positioning): No assistance needed to correctly position infant at breast.  LATCH Score: 10   Lactation Tools Discussed/Used    Interventions    Discharge    Consult Status Consult Status: Follow-up from L&D    Stevan Born [facility] 10/20/2020, 1:20 PM

## 2020-10-20 NOTE — Anesthesia Preprocedure Evaluation (Signed)
Anesthesia Evaluation  ?Patient identified by MRN, date of birth, ID band ?Patient awake ? ? ? ?Reviewed: ?Allergy & Precautions, H&P , NPO status , Patient's Chart, lab work & pertinent test results ? ?History of Anesthesia Complications ?Negative for: history of anesthetic complications ? ?Airway ?Mallampati: II ? ?TM Distance: >3 FB ? ? ? ? Dental ?  ?Pulmonary ?neg pulmonary ROS,  ?  ?Pulmonary exam normal ? ? ? ? ? ? ? Cardiovascular ?negative cardio ROS ? ? ?Rhythm:regular Rate:Normal ? ? ?  ?Neuro/Psych ?negative neurological ROS ? negative psych ROS  ? GI/Hepatic ?negative GI ROS, Neg liver ROS,   ?Endo/Other  ?Morbid obesity ? Renal/GU ?  ? ?  ?Musculoskeletal ? ? Abdominal ?  ?Peds ? Hematology ? ?(+) Blood dyscrasia, anemia ,   ?Anesthesia Other Findings ? ? Reproductive/Obstetrics ?(+) Pregnancy ? ?  ? ? ? ? ? ? ? ? ? ? ? ? ? ?  ?  ? ? ? ? ? ? ? ? ?Anesthesia Physical ?Anesthesia Plan ? ?ASA: 3 ? ?Anesthesia Plan: Epidural  ? ?Post-op Pain Management:   ? ?Induction:  ? ?PONV Risk Score and Plan:  ? ?Airway Management Planned:  ? ?Additional Equipment:  ? ?Intra-op Plan:  ? ?Post-operative Plan:  ? ?Informed Consent: I have reviewed the patients History and Physical, chart, labs and discussed the procedure including the risks, benefits and alternatives for the proposed anesthesia with the patient or authorized representative who has indicated his/her understanding and acceptance.  ? ? ? ? ? ?Plan Discussed with:  ? ?Anesthesia Plan Comments:   ? ? ? ? ? ? ?Anesthesia Quick Evaluation ? ?

## 2020-10-20 NOTE — Progress Notes (Signed)
Labor Progress Note [place] Fionna Merriott is a 30 y.o. [id number] at [redacted]w[redacted]d presented for IOL for ICP. S: sleeping comfortably in bed with epidural in place  O:  BP 108/63   Pulse 87   Temp 98.3 F (36.8 C) (Oral)   Resp 18   Ht 5\' 2"  (1.575 m)   Wt 100.2 kg   LMP 02/11/2020 (Approximate)   SpO2 100%   BMI 40.40 kg/m  EFM: 135/good variability/+accels/no decels  CVE: Dilation: 6 Effacement (%): 70 Cervical Position: Posterior Station: -3 Presentation: Vertex Exam by:: [email or web address]   A&P: 30 y.o. 38 [redacted]w[redacted]d here for IOL for ICP #Labor: Progressing well. Now in active labor. Consider AROM at next check. #Pain: well-controlled with epidural #FWB: cat 1 #GBS negative #ICP: ursodiol with hydroxyzine as needed  [redacted]w[redacted]d, MD, PGY-1, Faculty Service 7:19 AM

## 2020-10-20 NOTE — Discharge Summary (Addendum)
Postpartum Discharge Summary    Patient Name: Carolyn Gomez DOB: 1990/03/19 MRN: [id number]  Date of admission: 10/20/2020 Delivery date:10/20/2020  Delivering provider: Serita Grammes D  Date of discharge: 10/21/2020  Admitting diagnosis: Encounter for induction of labor [Z34.90] Intrauterine pregnancy: [redacted]w[redacted]d    Secondary diagnosis:  Active Problems:   Short interval between pregnancies affecting pregnancy, antepartum   Language barrier   Late prenatal care affecting pregnancy, antepartum   LGA (large for gestational age) fetus affecting management of mother   Anemia affecting pregnancy   History of postpartum hemorrhage   Cholestasis during pregnancy   Encounter for induction of labor   Vaginal delivery  Additional problems: none    Discharge diagnosis: Term Pregnancy Delivered and Anemia                                              Post partum procedures: none Augmentation: AROM and Cytotec Complications: None  Hospital course: Induction of Labor With Vaginal Delivery   30y.o. yo [id number] admitted to the hospital 10/20/2020 for induction of labor.  Indication for induction: Cholestasis of pregnancy.  Patient had an uncomplicated labor course, laboring after one dose of cytotec and then having AROM for augmentation. She was given TXA just after delivery for some initial brisk bleeding. Membrane Rupture Time/Date: 9:30 AM ,10/20/2020   Delivery Method:Vaginal, Spontaneous  Episiotomy: None  Lacerations:  None  Details of delivery can be found in separate delivery note.  Patient had a routine postpartum course. Patient is discharged home 10/21/20.  Newborn Data: Birth date:10/20/2020  Birth time:12:29 PM  Gender:Female  Living status:Living  Apgars:9 ,9  Weight:3374 g (7lb 7oz)  Magnesium Sulfate received: No BMZ received: No Rhophylac:N/A MMR:N/A T-DaP: Yes (08/26/20) Flu: No Transfusion:No  Physical exam  Vitals:   10/20/20 1623 10/20/20  1935 10/20/20 2325 10/21/20 0405  BP: (!) 101/56 (!) 112/52 (!) 103/59 101/68  Pulse: 95 95 92 83  Resp: _0 Temp: 98.5 F (36.9 C) 98.6 F (37 C) 99.9 F (37.7 C) 98.6 F (37 C)  TempSrc: Oral Oral Oral Oral  SpO2:  99% 99% 99%  Weight:      Height:       General: alert, cooperative, and no distress Lochia: appropriate Uterine Fundus: firm Incision: N/A DVT Evaluation: No evidence of DVT seen on physical exam. Labs: Lab Results  Component Value Date   WBC 10.3 10/20/2020   HGB 9.8 (L) 10/20/2020   HCT 32.0 (L) 10/20/2020   MCV 79.0 (L) 10/20/2020   PLT 415 (H) 10/20/2020   CMP Latest Ref Rng & Units 07/16/2020  Glucose 65 - 99 mg/dL 81  BUN 6 - 20 mg/dL 6  Creatinine 0.57 - 1.00 mg/dL 0.44(L)  Sodium 134 - 144 mmol/L 138  Potassium 3.5 - 5.2 mmol/L 4.5  Chloride 96 - 106 mmol/L 104  CO2 20 - 29 mmol/L 19(L)  Calcium 8.7 - 10.2 mg/dL 8.8  Total Protein 6.0 - 8.5 g/dL 6.7  Total Bilirubin 0.0 - 1.2 mg/dL 0.2  Alkaline Phos 44 - 121 IU/L 82  AST 0 - 40 IU/L 16  ALT 0 - 32 IU/L 13   Edinburgh Score: Edinburgh Postnatal Depression Scale Screening Tool 07/28/2019  I have been able to laugh and see the funny side of things. 0  I have looked  forward with enjoyment to things. 0  I have blamed myself unnecessarily when things went wrong. 0  I have been anxious or worried for no good reason. 0  I have felt scared or panicky for no good reason. 1  Things have been getting on top of me. 3  I have been so unhappy that I have had difficulty sleeping. 0  I have felt sad or miserable. 0  I have been so unhappy that I have been crying. 0  The thought of harming myself has occurred to me. 0  Edinburgh Postnatal Depression Scale Total 4     After visit meds:  Allergies as of 10/21/2020   No Known Allergies      Medication List     STOP taking these medications    docusate sodium 100 MG capsule Commonly known as: COLACE   hydrOXYzine 25 MG capsule Commonly  known as: Vistaril   ursodiol 500 MG tablet Commonly known as: ACTIGALL       TAKE these medications    acetaminophen 325 MG tablet Commonly known as: Tylenol Take 1 tablet (325 mg total) by mouth every 4 (four) hours as needed (for pain scale < 4).   ferrous sulfate 325 (65 FE) MG tablet Take 1 tablet (325 mg total) by mouth daily.   ibuprofen 600 MG tablet Commonly known as: ADVIL Take 1 tablet (600 mg total) by mouth every 6 (six) hours.   multivitamin-prenatal 27-0.8 MG Tabs tablet Take 1 tablet by mouth daily at 12 noon.   norethindrone 0.35 MG tablet Commonly known as: Ortho Micronor Take 1 tablet (0.35 mg total) by mouth daily. Take at the same time every day.         Discharge home in stable condition Infant Feeding: Bottle and Breast Infant Disposition:home with mother Discharge instruction: per After Visit Summary and Postpartum booklet. Activity: Advance as tolerated. Pelvic rest for 6 weeks.  Diet: routine diet Future Appointments: Future Appointments  Date Time Provider [place]  11/09/2020  1:50 PM Gildardo Pounds, NP [facility] None  11/18/2020 10:35 AM Radene Gunning, MD [facility]   Follow up Visit:  [place] Follow up on 11/09/2020.   Why: Appointment time is Monday 1:50 pm. there will be an interepreter there. New patient appointment for PCP. bring photo ID. please call if you need to change or cancel appointment. Contact information: [place] [phone] [phone]                Myrtis Ser, CNM  P [facility] Admin Pool Please schedule this patient for Postpartum visit in: 4 weeks with the following provider: Any provider  In-Person  For C/S patients schedule nurse incision check in weeks 2 weeks: no  High risk pregnancy complicated by: cholestasis  Delivery mode:  SVD  Anticipated Birth Control:  OCPs  PP Procedures  needed: none  Schedule Integrated [place] visit: no   Wells Guiles, DO 10/21/2020, 12:37 PM PGY-1, [facility]  I spoke with and examined patient and agree with resident/PA-S/MS/SNM's note and plan of care.  Roma Schanz, CNM, [place] 10/26/2020 10:23 AM

## 2020-10-21 ENCOUNTER — Ambulatory Visit: Payer: Self-pay

## 2020-10-21 MED ORDER — NORETHINDRONE 0.35 MG PO TABS: 1.0000 | ORAL_TABLET | Freq: Every day | ORAL | 2 refills | Status: DC

## 2020-10-21 MED ORDER — ACETAMINOPHEN 325 MG PO TABS: 325.0000 mg | ORAL_TABLET | ORAL | 0 refills | Status: DC | PRN

## 2020-10-21 MED ORDER — IBUPROFEN 600 MG PO TABS: 600.0000 mg | ORAL_TABLET | Freq: Four times a day (QID) | ORAL | 0 refills | Status: DC

## 2020-10-21 NOTE — Anesthesia Postprocedure Evaluation (Signed)
Anesthesia Post Note  Patient: Carolyn Gomez  Procedure(s) Performed: AN AD HOC LABOR EPIDURAL     Patient location during evaluation: Mother Baby Anesthesia Type: Epidural Level of consciousness: awake and alert Pain management: pain level controlled Vital Signs Assessment: post-procedure vital signs reviewed and stable Respiratory status: spontaneous breathing, nonlabored ventilation and respiratory function stable Cardiovascular status: stable Postop Assessment: no headache, no backache, epidural receding, no apparent nausea or vomiting, patient able to bend at knees, adequate PO intake and able to ambulate Anesthetic complications: no   No notable events documented.  Last Vitals:  Vitals:   10/20/20 2325 10/21/20 0405  BP: (!) 103/59 101/68  Pulse: 92 83  Resp: 18 18  Temp: 37.7 C 37 C  SpO2: 99% 99%    Last Pain:  Vitals:   10/21/20 1122  TempSrc:   PainSc: 0-No pain   Pain Goal:                   [place]

## 2020-10-21 NOTE — Lactation Note (Signed)
This note was copied from a baby's chart. Lactation Consultation Note  Patient Name: Carolyn Gomez [id number] Date: 10/21/2020 Reason for consult: Follow-up assessment;Early term 37-38.6wks Age:31 hours  FOB interpreting for LC  LC in to visit with P3 Mom of ET infant on day of discharge.  Baby is at 3% weight loss with good output.  Mom reports baby is latching well, no discomfort.   Mom has chosen to offer baby formula after breastfeeding until her breasts fill.  Encouraged STS and offering breast with cues.  Mom understands importance of a deep latch. Engorgement prevention and treatment reviewed.  Mom denies any questions.  Lactation Tools Discussed/Used Tools: Bottle;Pump Breast pump type: Manual  Interventions Interventions: Breast feeding basics reviewed;Skin to skin;Breast massage;Hand express;Hand pump;Education  Discharge Discharge Education: Engorgement and breast care Pump: Manual  Consult Status Consult Status: Complete Date: 10/21/20 Follow-up type: Call as needed    [name] 10/21/2020, 5:51 PM

## 2020-10-26 ENCOUNTER — Encounter (HOSPITAL_COMMUNITY): Payer: Self-pay | Admitting: Emergency Medicine

## 2020-10-26 ENCOUNTER — Encounter: Payer: Self-pay | Admitting: Family Medicine

## 2020-10-26 ENCOUNTER — Other Ambulatory Visit: Payer: Self-pay

## 2020-10-26 ENCOUNTER — Inpatient Hospital Stay (HOSPITAL_COMMUNITY)
Admission: EM | Admit: 2020-10-26 | Discharge: 2020-10-26 | Disposition: A | Discharge status: Home / Self Care | Payer: Self-pay | Attending: Obstetrics & Gynecology | Admitting: Obstetrics & Gynecology

## 2020-10-26 DIAGNOSIS — R109 Unspecified abdominal pain: Secondary | ICD-10-CM

## 2020-10-26 DIAGNOSIS — O99893 Other specified diseases and conditions complicating puerperium: Secondary | ICD-10-CM

## 2020-10-26 DIAGNOSIS — M7918 Myalgia, other site: Secondary | ICD-10-CM | POA: Insufficient documentation

## 2020-10-26 DIAGNOSIS — O9089 Other complications of the puerperium, not elsewhere classified: Secondary | ICD-10-CM

## 2020-10-26 DIAGNOSIS — Z79899 Other long term (current) drug therapy: Secondary | ICD-10-CM | POA: Insufficient documentation

## 2020-10-26 LAB — CBC WITH DIFFERENTIAL/PLATELET
Abs Immature Granulocytes: 0.12 10*3/uL — ABNORMAL HIGH (ref 0.00–0.07)
Basophils Absolute: 0.1 10*3/uL (ref 0.0–0.1)
Basophils Relative: 0 %
Eosinophils Absolute: 0.5 10*3/uL (ref 0.0–0.5)
Eosinophils Relative: 4 %
HCT: 28.8 % — ABNORMAL LOW (ref 36.0–46.0)
Hemoglobin: 8.8 g/dL — ABNORMAL LOW (ref 12.0–15.0)
Immature Granulocytes: 1 %
Lymphocytes Relative: 19 %
Lymphs Abs: 2.3 10*3/uL (ref 0.7–4.0)
MCH: 25.4 pg — ABNORMAL LOW (ref 26.0–34.0)
MCHC: 30.6 g/dL (ref 30.0–36.0)
MCV: 83 fL (ref 80.0–100.0)
Monocytes Absolute: 0.7 10*3/uL (ref 0.1–1.0)
Monocytes Relative: 6 %
Neutro Abs: 8.2 10*3/uL — ABNORMAL HIGH (ref 1.7–7.7)
Neutrophils Relative %: 70 %
Platelets: 479 10*3/uL — ABNORMAL HIGH (ref 150–400)
RBC: 3.47 MIL/uL — ABNORMAL LOW (ref 3.87–5.11)
RDW: 25.1 % — ABNORMAL HIGH (ref 11.5–15.5)
WBC: 11.9 10*3/uL — ABNORMAL HIGH (ref 4.0–10.5)
nRBC: 0 % (ref 0.0–0.2)

## 2020-10-26 LAB — URINALYSIS, ROUTINE W REFLEX MICROSCOPIC
Bilirubin Urine: NEGATIVE
Glucose, UA: NEGATIVE mg/dL
Ketones, ur: NEGATIVE mg/dL
Nitrite: NEGATIVE
Protein, ur: NEGATIVE mg/dL
Specific Gravity, Urine: 1.01 (ref 1.005–1.030)
pH: 6 (ref 5.0–8.0)

## 2020-10-26 LAB — COMPREHENSIVE METABOLIC PANEL
ALT: 29 U/L (ref 0–44)
AST: 37 U/L (ref 15–41)
Albumin: 2.9 g/dL — ABNORMAL LOW (ref 3.5–5.0)
Alkaline Phosphatase: 86 U/L (ref 38–126)
Anion gap: 6 (ref 5–15)
BUN: 9 mg/dL (ref 6–20)
CO2: 23 mmol/L (ref 22–32)
Calcium: 8.1 mg/dL — ABNORMAL LOW (ref 8.9–10.3)
Chloride: 109 mmol/L (ref 98–111)
Creatinine, Ser: 0.59 mg/dL (ref 0.44–1.00)
GFR, Estimated: >60 mL/min (ref >60)
Glucose, Bld: 97 mg/dL (ref 70–99)
Potassium: 3.7 mmol/L (ref 3.5–5.1)
Sodium: 138 mmol/L (ref 135–145)
Total Bilirubin: 0.4 mg/dL (ref 0.3–1.2)
Total Protein: 5.9 g/dL — ABNORMAL LOW (ref 6.5–8.1)

## 2020-10-26 MED ORDER — CYCLOBENZAPRINE HCL 10 MG PO TABS: 10.0000 mg | ORAL_TABLET | Freq: Two times a day (BID) | ORAL | 0 refills | Status: DC | PRN

## 2020-10-26 MED ORDER — CYCLOBENZAPRINE HCL 5 MG PO TABS
5.0000 mg | ORAL_TABLET | Freq: Once | ORAL | Status: AC
  Administered 2020-10-26: 5 mg via ORAL
  Filled 2020-10-26: qty 1

## 2020-10-26 MED ORDER — OXYCODONE-ACETAMINOPHEN 5-325 MG PO TABS
2.0000 | ORAL_TABLET | Freq: Once | ORAL | Status: AC
  Administered 2020-10-26: 2 via ORAL
  Filled 2020-10-26: qty 2

## 2020-10-26 NOTE — ED Triage Notes (Signed)
Triage completed w/ the use of a spanish interpretor.  Pt c/o right lower abdominal pain "for a few days".  Pt had a vaginal delivery 6 days ago.  Pt has been taking tylenol every 4 hours w/o relief.

## 2020-10-26 NOTE — ED Provider Notes (Signed)
Emergency Medicine Provider OB Triage Evaluation Note  [name] is a 30 y.o. female, 6 days postpartum with vaginal delivery who presents to the emergency department with complaints of severe, ongoing and worsening right-sided abdominal pain.  Patient reports this has been present since her delivery.  Reports initially she thought this was secondary to delivery but has never had pain like this with other deliveries.  Denies fevers or chills, nausea or vomiting.  Denies dysuria.   Review of  Systems  Positive: Right side abdominal pain Negative: Vomiting, diarrhea, fevers  Physical Exam  BP 140/75 (BP Location: Right Arm)   Pulse 76   Temp 98.6 F (37 C) (Oral)   Resp 20   LMP 02/11/2020 (Approximate)   SpO2 100%   General: Awake, tearful HEENT: Atraumatic  Resp: Normal effort  Cardiac: Normal rate Abd: Nondistended, to palpation along the right side MSK: Moves all extremities without difficulty Neuro: Speech clear  Medical Decision Making  Pt evaluated for pregnancy concern and is stable for transfer to MAU. Pt is in agreement with plan for transfer.  1:42 AM Discussed with MAU APP, Rayfield Citizen, who accepts patient in transfer.  Clinical Impression   1. Right sided abdominal pain   2. Postpartum complication        Milta Deiters 10/26/20 0150    Geoffery Lyons, MD 10/26/20 [phone]

## 2020-10-26 NOTE — MAU Provider Note (Addendum)
Chief Complaint:  Abdominal Pain    HPI: Carolyn Gomez is a 30 y.o. [phone] 6 days postpartum s/p SVD who presents to maternity admissions reporting constant right sided abdominal pain that runs along rib cage and radiates towards umbilicus. Patient is unable to describe pain, but reports that "it hurts and is always there", but has periods when it is more severe. The severe pain occurs ~3x/day. Pain started approximately 4 days ago. Nothing makes the pain better or worse, although she reports that when she stands she feels as if she is unable to move because the pain is so bad. She reports that she has been taking Tylenol and Ibuprofen every 5-6 hours without complete resolution of pain. She denies headache, vision changes, fever, chills, nausea/vomiting or diarrhea. She reports that she did have some constipation 3 days ago, but no longer feels constipation. No urinary symptoms. Vaginal bleeding is scant.   Pregnancy Course:   Past Medical History:  Diagnosis Date   Medical history non-contributory    OB History  Gravida Para Term Preterm AB Living  3 3 2 1  0 3  SAB IAB Ectopic Multiple Live Births  0 0 0 0 2    # Outcome Date GA Lbr Len/2nd Weight Sex Delivery Anes PTL Lv  3 Term 10/20/20 [redacted]w[redacted]d 01:55 / 01:04 3374 g M Vag-Spont EPI  LIV  2 Preterm 07/27/19 [redacted]w[redacted]d  2296 g F Vag-Spont        Complications: History of preterm premature rupture of membranes (PPROM)  1 Term 07/14/06 [redacted]w[redacted]d  3100 g M Vag-Spont   LIV     Complications: Other Excessive Bleeding    Obstetric Comments  Per patient, patient had anemia in last pregnancy and required transfusion after delivery. She reports previously her anemia was 'severe' and required 1 unit pRBC postpartum.    Postpartum she reports she had some heavy bleeding but this resolved.    Past Surgical History:  Procedure Laterality Date   NO PAST SURGERIES     Family History  Problem Relation Age of Onset   Cancer Neg Hx    Diabetes Neg Hx     Hyperlipidemia Neg Hx    Hypertension Neg Hx    Social History   Tobacco Use   Smoking status: Never   Smokeless tobacco: Never  Vaping Use   Vaping Use: Never used  Substance Use Topics   Alcohol use: Never    Comment: occasionally drink a beer   Drug use: Never   No Known Allergies Medications Prior to Admission  Medication Sig Dispense Refill Last Dose   acetaminophen (TYLENOL) 325 MG tablet Take 1 tablet (325 mg total) by mouth every 4 (four) hours as needed (for pain scale < 4). 30 tablet 0 10/26/2020   ibuprofen (ADVIL) 600 MG tablet Take 1 tablet (600 mg total) by mouth every 6 (six) hours. 30 tablet 0 10/26/2020   Prenatal Vit-Fe Fumarate-FA (MULTIVITAMIN-PRENATAL) 27-0.8 MG TABS tablet Take 1 tablet by mouth daily at 12 noon.   10/25/2020   ferrous sulfate 325 (65 FE) MG tablet Take 1 tablet (325 mg total) by mouth daily. (Patient not taking: Reported on 10/15/2020) 30 tablet 3    norethindrone (ORTHO MICRONOR) 0.35 MG tablet Take 1 tablet (0.35 mg total) by mouth daily. Take at the same time every day. 30 tablet 2     I have reviewed patient's Past Medical Hx, Surgical Hx, Family Hx, Social Hx, medications and allergies.   ROS:  Review  of Systems  Constitutional: Negative.   Respiratory: Negative.    Cardiovascular: Negative.   Gastrointestinal:  Positive for abdominal pain (patient points at rib cage and radiates midline toward umbilicus). Negative for constipation, diarrhea and vomiting.  Genitourinary:  Positive for vaginal bleeding (scant).  Musculoskeletal: Negative.   Neurological: Negative.   Psychiatric/Behavioral: Negative.     Physical Exam  Patient Vitals for the past 24 hrs:  BP Temp Temp src Pulse Resp SpO2 Height Weight  10/26/20 0415 109/88 -- -- 72 -- -- -- --  10/26/20 0400 112/61 -- -- 71 -- -- -- --  10/26/20 0345 127/82 -- -- 79 -- -- -- --  10/26/20 0330 117/75 -- -- 81 -- -- -- --  10/26/20 0232 119/76 98 F (36.7 C) Oral 78 20 100 % 5'  2" (1.575 m) 94.6 kg  10/26/20 0105 140/75 98.6 F (37 C) Oral 76 20 100 % -- --   Constitutional: well-developed, well-nourished female in no acute distress, she is breastfeeding her baby  Cardiovascular: normal rate Respiratory: normal effort GI: soft, tenderness along right side rib cage and mid abdomen towards umbilicus, no rebound tenderness over Mcburney's point, negative Murphy's sign MS: extremities nontender, +1 BLE edema, normal ROM Neurologic: alert and oriented x 4.  GU: neg CVAT. Pelvic: not indicated     Labs: Results for orders placed or performed during the hospital encounter of 10/26/20 (from the past 24 hour(s))  CBC with Differential/Platelet     Status: Abnormal   Collection Time: 10/26/20  2:26 AM  Result Value Ref Range   WBC 11.9 (H) 4.0 - 10.5 K/uL   RBC 3.47 (L) 3.87 - 5.11 MIL/uL   Hemoglobin 8.8 (L) 12.0 - 15.0 g/dL   HCT 36.6 (L) 44.0 - 34.7 %   MCV 83.0 80.0 - 100.0 fL   MCH 25.4 (L) 26.0 - 34.0 pg   MCHC 30.6 30.0 - 36.0 g/dL   RDW 42.5 (H) 95.6 - 38.7 %   Platelets 479 (H) 150 - 400 K/uL   nRBC 0.0 0.0 - 0.2 %   Neutrophils Relative % 70 %   Neutro Abs 8.2 (H) 1.7 - 7.7 K/uL   Lymphocytes Relative 19 %   Lymphs Abs 2.3 0.7 - 4.0 K/uL   Monocytes Relative 6 %   Monocytes Absolute 0.7 0.1 - 1.0 K/uL   Eosinophils Relative 4 %   Eosinophils Absolute 0.5 0.0 - 0.5 K/uL   Basophils Relative 0 %   Basophils Absolute 0.1 0.0 - 0.1 K/uL   Immature Granulocytes 1 %   Abs Immature Granulocytes 0.12 (H) 0.00 - 0.07 K/uL  Comprehensive metabolic panel     Status: Abnormal   Collection Time: 10/26/20  2:26 AM  Result Value Ref Range   Sodium 138 135 - 145 mmol/L   Potassium 3.7 3.5 - 5.1 mmol/L   Chloride 109 98 - 111 mmol/L   CO2 23 22 - 32 mmol/L   Glucose, Bld 97 70 - 99 mg/dL   BUN 9 6 - 20 mg/dL   Creatinine, Ser 5.64 0.44 - 1.00 mg/dL   Calcium 8.1 (L) 8.9 - 10.3 mg/dL   Total Protein 5.9 (L) 6.5 - 8.1 g/dL   Albumin 2.9 (L) 3.5 - 5.0 g/dL    AST 37 15 - 41 U/L   ALT 29 0 - 44 U/L   Alkaline Phosphatase 86 38 - 126 U/L   Total Bilirubin 0.4 0.3 - 1.2 mg/dL   GFR, Estimated >33 >  60 mL/min   Anion gap 6 5 - 15  Urinalysis, Routine w reflex microscopic Urine, Clean Catch     Status: Abnormal   Collection Time: 10/26/20  2:46 AM  Result Value Ref Range   Color, Urine YELLOW YELLOW   APPearance HAZY (A) CLEAR   Specific Gravity, Urine 1.010 1.005 - 1.030   pH 6.0 5.0 - 8.0   Glucose, UA NEGATIVE NEGATIVE mg/dL   Hgb urine dipstick LARGE (A) NEGATIVE   Bilirubin Urine NEGATIVE NEGATIVE   Ketones, ur NEGATIVE NEGATIVE mg/dL   Protein, ur NEGATIVE NEGATIVE mg/dL   Nitrite NEGATIVE NEGATIVE   Leukocytes,Ua MODERATE (A) NEGATIVE   RBC / HPF 11-20 0 - 5 RBC/hpf   WBC, UA 21-50 0 - 5 WBC/hpf   Bacteria, UA FEW (A) NONE SEEN   Squamous Epithelial / LPF 6-10 0 - 5   Mucus PRESENT     Imaging:  No results found.  MAU Course: Orders Placed This Encounter  Procedures   CBC with Differential/Platelet   Comprehensive metabolic panel   Urinalysis, Routine w reflex microscopic Urine, Clean Catch   Discharge patient   Meds ordered this encounter  Medications   cyclobenzaprine (FLEXERIL) tablet 5 mg   oxyCODONE-acetaminophen (PERCOCET/ROXICET) 5-325 MG per tablet 2 tablet   cyclobenzaprine (FLEXERIL) 10 MG tablet    Sig: Take 1 tablet (10 mg total) by mouth 2 (two) times daily as needed for muscle spasms.    Dispense:  20 tablet    Refill:  0    Order Specific Question:   Supervising Provider    Answer:   Samara Snide    MDM: VSS. 1 elevated BP in MCED, all others normotensive, patient is not tachycardic and is afebrile  CBC, CMP unremarkable  Flexeril PO with no improvement in pain Percocet PO given with improving pain, now rating pain 2/10 Pain likely musculoskeletal given presentation Spanish interpretor used through entirety of visit  Assessment: 1. Postpartum complication   2. Musculoskeletal pain      Plan: Discharge home in stable condition  Strict return precautions reviewed. Return to MAU as needed Keep appointment as scheduled Flexeril sent to pharmacy    Follow-up Information     Center for [facility] Follow up.   Specialty: Obstetrics and Gynecology Why: as scheduled. Return to MAU as needed. Contact information: [address] [place] [address] [phone]                Allergies as of 10/26/2020   No Known Allergies      Medication List     TAKE these medications    acetaminophen 325 MG tablet Commonly known as: Tylenol Take 1 tablet (325 mg total) by mouth every 4 (four) hours as needed (for pain scale < 4).   cyclobenzaprine 10 MG tablet Commonly known as: FLEXERIL Take 1 tablet (10 mg total) by mouth 2 (two) times daily as needed for muscle spasms.   ferrous sulfate 325 (65 FE) MG tablet Take 1 tablet (325 mg total) by mouth daily.   ibuprofen 600 MG tablet Commonly known as: ADVIL Take 1 tablet (600 mg total) by mouth every 6 (six) hours.   multivitamin-prenatal 27-0.8 MG Tabs tablet Take 1 tablet by mouth daily at 12 noon.   norethindrone 0.35 MG tablet Commonly known as: Ortho Micronor Take 1 tablet (0.35 mg total) by mouth daily. Take at the same time every day.  Camelia Eng, CNM 10/26/2020 5:27 AM

## 2020-10-26 NOTE — MAU Note (Addendum)
..  [name] is a 30 y.o. at 6 days PP here in MAU reporting: Right sided abdominal pain, reports she has had the pain since the day she left the hospital but was taking the pain medicine (tylenol and ibuprofen)  and it helped but did not tonight. Now the pain gets more intense at times. Reports her legs are more swollen.  Pain score: 3/10 Vitals:   10/26/20 0105  BP: 140/75  Pulse: 76  Resp: 20  Temp: 98.6 F (37 C)  SpO2: 100%     Lab orders placed from triage: UA

## 2020-10-26 NOTE — ED Notes (Signed)
Report given to MAU RN, transport called.

## 2020-11-03 ENCOUNTER — Telehealth (HOSPITAL_COMMUNITY): Payer: Self-pay | Admitting: *Deleted

## 2020-11-03 NOTE — Telephone Encounter (Signed)
No voicemail set up, no message left by interpreter.  Duffy Rhody, Rn 11-03-2020 at 11:33am

## 2020-11-09 ENCOUNTER — Ambulatory Visit: Payer: MEDICAID | Attending: Nurse Practitioner | Admitting: Nurse Practitioner

## 2020-11-09 ENCOUNTER — Other Ambulatory Visit: Payer: Self-pay

## 2020-11-09 ENCOUNTER — Encounter: Payer: Self-pay | Admitting: Nurse Practitioner

## 2020-11-09 VITALS — BP 131/86 | HR 73 | Ht 62.0 in | Wt 200.4 lb

## 2020-11-09 DIAGNOSIS — G43109 Migraine with aura, not intractable, without status migrainosus: Secondary | ICD-10-CM

## 2020-11-09 DIAGNOSIS — Z7689 Persons encountering health services in other specified circumstances: Secondary | ICD-10-CM

## 2020-11-09 DIAGNOSIS — Z23 Encounter for immunization: Secondary | ICD-10-CM

## 2020-11-09 DIAGNOSIS — K831 Obstruction of bile duct: Secondary | ICD-10-CM

## 2020-11-09 MED ORDER — URSODIOL 500 MG PO TABS: 500.0000 mg | ORAL_TABLET | Freq: Two times a day (BID) | ORAL | 0 refills | Status: AC

## 2020-11-09 MED ORDER — SUMATRIPTAN SUCCINATE 50 MG PO TABS: 50.0000 mg | ORAL_TABLET | Freq: Once | ORAL | 1 refills | Status: DC

## 2020-11-09 NOTE — Progress Notes (Signed)
Assessment & Plan:  [place] was seen today for establish care.  Diagnoses and all orders for this visit:  Encounter to establish care  Migraine with aura and without status migrainosus, not intractable -     SUMAtriptan (IMITREX) 50 MG tablet; Take 1 tablet (50 mg total) by mouth once for 1 dose. May repeat in 2 hours if headache persists or recurs.  Cholestasis -     ursodiol (ACTIGALL) 500 MG tablet; Take 1 tablet (500 mg total) by mouth 2 (two) times daily for 14 days.  Need for immunization against influenza -     Flu Vaccine QUAD 38mo+IM (Fluarix, Fluzone & Alfiuria Quad PF)   Patient has been counseled on age-appropriate routine health concerns for screening and prevention. These are reviewed and up-to-date. Referrals have been placed accordingly. Immunizations are up-to-date or declined.    Subjective:   Chief Complaint  Patient presents with   Establish Care   HPI [place] 30 y.o. female presents to office today to establish care.  There is an onsite interpreter accompanying her today   She was recently admitted to the hospital on 10-24 with post partum complications of right sided abdominal pain 6 days after vaginal delivery. She denies any right sided pain today. Has f/u with GYN on 11-18-2020   Has history of cholestasis causing intense pruritis. She was taking ursodiol for this which was prescribed by OB/GYN. States she was instructed that her pruritis would stop after giving birth however it currently has not resolved. She is requesting a refill at this time of the ursodiol. Will complimentary fill for her today. Any future refills will need to come from GYN. She also has a history of anemia.    Headaches Occuring almost daily and with auras: photophobia. Unrelieved with OTC tylenol. She is breastfeeding at this time so will try imitrex.      Review of Systems  Constitutional:  Negative for fever, malaise/fatigue and weight loss.  HENT: Negative.   Negative for nosebleeds.   Eyes: Negative.  Negative for blurred vision, double vision and photophobia.  Respiratory: Negative.  Negative for cough and shortness of breath.   Cardiovascular: Negative.  Negative for chest pain, palpitations and leg swelling.  Gastrointestinal: Negative.  Negative for heartburn, nausea and vomiting.  Musculoskeletal: Negative.  Negative for myalgias.  Skin:  Positive for itching. Negative for rash.  Neurological:  Positive for headaches. Negative for dizziness, focal weakness and seizures.  Psychiatric/Behavioral: Negative.  Negative for suicidal ideas.    Past Medical History:  Diagnosis Date   Anemia    Medical history non-contributory     Past Surgical History:  Procedure Laterality Date   NO PAST SURGERIES      Family History  Problem Relation Age of Onset   Cancer Neg Hx    Diabetes Neg Hx    Hyperlipidemia Neg Hx    Hypertension Neg Hx     Social History Reviewed with no changes to be made today.   Outpatient Medications Prior to Visit  Medication Sig Dispense Refill   acetaminophen (TYLENOL) 325 MG tablet Take 1 tablet (325 mg total) by mouth every 4 (four) hours as needed (for pain scale < 4). 30 tablet 0   ferrous sulfate 325 (65 FE) MG tablet Take 1 tablet (325 mg total) by mouth daily. 30 tablet 3   ibuprofen (ADVIL) 600 MG tablet Take 1 tablet (600 mg total) by mouth every 6 (six) hours. 30 tablet 0  norethindrone (ORTHO MICRONOR) 0.35 MG tablet Take 1 tablet (0.35 mg total) by mouth daily. Take at the same time every day. 30 tablet 2   Prenatal Vit-Fe Fumarate-FA (MULTIVITAMIN-PRENATAL) 27-0.8 MG TABS tablet Take 1 tablet by mouth daily at 12 noon.     cyclobenzaprine (FLEXERIL) 10 MG tablet Take 1 tablet (10 mg total) by mouth 2 (two) times daily as needed for muscle spasms. (Patient not taking: Reported on 11/09/2020) 20 tablet 0   No facility-administered medications prior to visit.    No Known Allergies     Objective:     BP 131/86   Pulse 73   Ht 5\' 2"  (1.575 m)   Wt 200 lb 6 oz (90.9 kg)   SpO2 99%   Breastfeeding Yes   BMI 36.65 kg/m  Wt Readings from Last 3 Encounters:  11/09/20 200 lb 6 oz (90.9 kg)  10/26/20 208 lb 9.6 oz (94.6 kg)  10/20/20 220 lb 14.4 oz (100.2 kg)    Physical Exam Vitals and nursing note reviewed.  Constitutional:      Appearance: She is well-developed.  HENT:     Head: Normocephalic and atraumatic.  Cardiovascular:     Rate and Rhythm: Normal rate and regular rhythm.     Heart sounds: Normal heart sounds. No murmur heard.   No friction rub. No gallop.  Pulmonary:     Effort: Pulmonary effort is normal. No tachypnea or respiratory distress.     Breath sounds: Normal breath sounds. No decreased breath sounds, wheezing, rhonchi or rales.  Chest:     Chest wall: No tenderness.  Abdominal:     General: Bowel sounds are normal.     Palpations: Abdomen is soft.  Musculoskeletal:        General: Normal range of motion.     Cervical back: Normal range of motion.  Skin:    General: Skin is warm and dry.  Neurological:     Mental Status: She is alert and oriented to person, place, and time.     Coordination: Coordination normal.  Psychiatric:        Behavior: Behavior normal. Behavior is cooperative.        Thought Content: Thought content normal.        Judgment: Judgment normal.         Patient has been counseled extensively about nutrition and exercise as well as the importance of adherence with medications and regular follow-up. The patient was given clear instructions to go to ER or return to medical center if symptoms don't improve, worsen or new problems develop. The patient verbalized understanding.   Follow-up: Return in about 3 months (around 02/09/2021).   04/09/2021, FNP-BC [facility] [place], [place]   11/09/2020, 3:30 PM

## 2020-11-17 ENCOUNTER — Other Ambulatory Visit: Payer: Self-pay | Admitting: General Practice

## 2020-11-17 DIAGNOSIS — O9081 Anemia of the puerperium: Secondary | ICD-10-CM

## 2020-11-17 DIAGNOSIS — G43109 Migraine with aura, not intractable, without status migrainosus: Secondary | ICD-10-CM | POA: Insufficient documentation

## 2020-11-17 MED ORDER — FERROUS SULFATE 325 (65 FE) MG PO TABS: 325.0000 mg | ORAL_TABLET | ORAL | 0 refills | Status: DC

## 2020-11-17 NOTE — Progress Notes (Deleted)
    Post Partum Visit Note  Carolyn Gomez Carolyn Gomez is a 30 y.o. [phone] female who presents for a postpartum visit. She is 4 weeks postpartum following a normal spontaneous vaginal delivery.  I have fully reviewed the prenatal and intrapartum course. The delivery was at 37 gestational weeks.  Anesthesia: epidural. Postpartum course has been uncomplicated. Baby is doing well. Baby is feeding by {breast/bottle:69}. Bleeding {vag bleed:12292}. Bowel function is {normal:32111}. Bladder function is {normal:32111}. Patient {is/is not:9024} sexually active. Contraception method is {contraceptive method:5051}. Postpartum depression screening: {gen negative/positive:315881}.   The pregnancy intention screening data noted above was reviewed. Potential methods of contraception were discussed. The patient elected to proceed with No data recorded.    Health Maintenance Due  Topic Date Due   Pneumococcal Vaccine 94-62 Years old (1 - PCV) Never done   COVID-19 Vaccine (3 - Booster for Pfizer series) 10/22/2019    The following portions of the patient's history were reviewed and updated as appropriate: allergies, current medications, past family history, past medical history, past social history, past surgical history, and problem list.  Review of Systems Pertinent items are noted in HPI.  Objective:  There were no vitals taken for this visit.   General:  alert, cooperative, appears stated age, and no distress   Breasts:  not indicated  Lungs: clear to auscultation bilaterally  Heart:  regular rate and rhythm, S1, S2 normal, no murmur, click, rub or gallop  Abdomen: soft, non-tender; bowel sounds normal; no masses,  no organomegaly   Wound N/a  GU exam:  not indicated       Assessment:    1. Language barrier - Spanish interpreter used throughout the appointment  2. Migraine with aura and without status migrainosus, not intractable - Discussed contraindication of combined OCPs   Normal  postpartum exam.   Plan:   Essential components of care per ACOG recommendations:  1.  Mood and well being: Patient with {gen negative/positive:315881} depression screening today. Reviewed local resources for support.  - Patient tobacco use? No.   - hx of drug use? No.    2. Infant care and feeding:  -Patient currently breastmilk feeding? {yes/no:25502}  -Social determinants of health (SDOH) reviewed in EPIC. No concerns  3. Sexuality, contraception and birth spacing - Patient {DOES_DOES OZD:66440} want a pregnancy in the next year.  Desired family size is {NUMBER 1-10:22536} children.  - Reviewed forms of contraception in tiered fashion. Patient desired {PLAN CONTRACEPTION:313102} today.   - Discussed birth spacing of 18 months  4. Sleep and fatigue -Encouraged family/partner/community support of 4 hrs of uninterrupted sleep to help with mood and fatigue  5. Physical Recovery  - Discussed patients delivery and complications. She describes her labor as {description:25511} - Patient had a Vaginal, no problems at delivery. Patient had  no  laceration. Perineal healing reviewed. Patient expressed understanding - Patient has urinary incontinence? {yes/no:25515} - Patient is safe to resume physical and sexual activity  6.  Health Maintenance - HM due items addressed Yes - Last pap smear  Diagnosis  Date Value Ref Range Status  03/06/2019   Final   - Negative for intraepithelial lesion or malignancy (NILM)   Pap smear not done at today's visit.  -Breast Cancer screening indicated? No.   7. Chronic Disease/Pregnancy Condition follow up: None  - PCP follow up  Milas Hock, MD Center for [facility], [facility]

## 2020-11-18 ENCOUNTER — Ambulatory Visit: Payer: Self-pay | Admitting: Obstetrics and Gynecology

## 2020-12-02 ENCOUNTER — Other Ambulatory Visit: Payer: Self-pay | Admitting: Family Medicine

## 2020-12-02 DIAGNOSIS — O9081 Anemia of the puerperium: Secondary | ICD-10-CM

## 2020-12-03 ENCOUNTER — Ambulatory Visit: Payer: Self-pay | Admitting: Student

## 2020-12-16 ENCOUNTER — Ambulatory Visit: Payer: Self-pay | Admitting: Certified Nurse Midwife

## 2021-01-03 NOTE — L&D Delivery Note (Signed)
OB/GYN Faculty Practice Delivery Note  Carolyn Gomez is a 31 y.o. [phone] s/p VD at [redacted]w[redacted]d. She was admitted for SOL.   ROM: 2h 75m with clear fluid GBS Status:  Positive/-- (10/05 0000)  Labor Progress: Initial SVE: 3.5/50/-3. She then progressed to complete.   Delivery Date/Time: 10/22/21 1138 Delivery: Called to room and patient was complete and pushing. Head delivered OA. No nuchal cord present. Shoulder and body delivered in usual fashion. Infant with spontaneous cry, placed on mother's abdomen, dried and stimulated. Cord clamped x 2 after 1-minute delay, and cut by FOB. Cord blood drawn. Placenta delivered spontaneously with gentle cord traction. Fundus firm with massage and Pitocin. Labia, perineum, vagina, and cervix inspected with no lacerations noted.  Baby Weight: pending  Placenta: 3 vessel, intact. Sent to L&D Complications: None Lacerations: none EBL: 102 mL Analgesia: Epidural   Infant:  APGAR (1 MIN): 8   APGAR (5 MINS): [place], DO OB Family Medicine Fellow, [facility] for [employer], [employer] [facility] 10/22/2021, 12:45 PM

## 2021-01-09 ENCOUNTER — Other Ambulatory Visit: Payer: Self-pay | Admitting: Student

## 2021-02-10 ENCOUNTER — Ambulatory Visit: Payer: Self-pay | Admitting: Nurse Practitioner

## 2021-06-05 IMAGING — US US OB < 14 WEEKS - US OB TV
1 series · 15 of 28 positions shown · non-contrast
Comparison: None.

CLINICAL DATA: Pelvic pain with positive pregnancy test

EXAM:
OBSTETRIC <14 WK US AND TRANSVAGINAL OB US
TECHNIQUE: Both transabdominal and transvaginal ultrasound examinations were
performed for complete evaluation of the gestation as well as the
maternal uterus, adnexal regions, and pelvic cul-de-sac.
Transvaginal technique was performed to assess early pregnancy.

[Series 1: us ob < 14 weeks - us ob tv · 70 acquisitions, 15 frames shown]
[im 1/70]
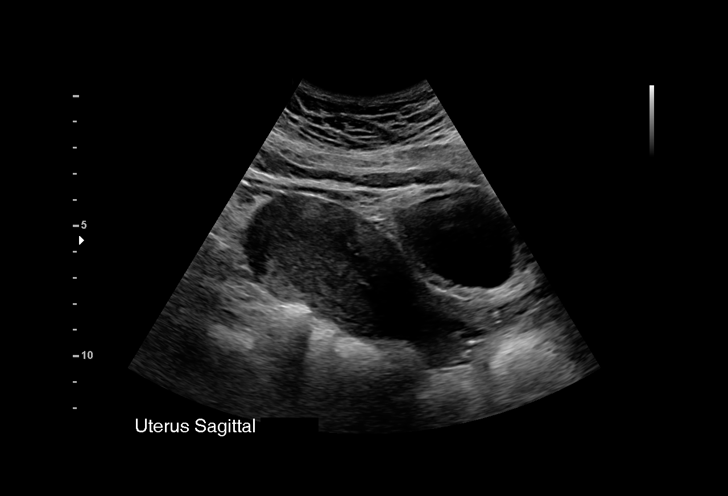
[im 6/70]
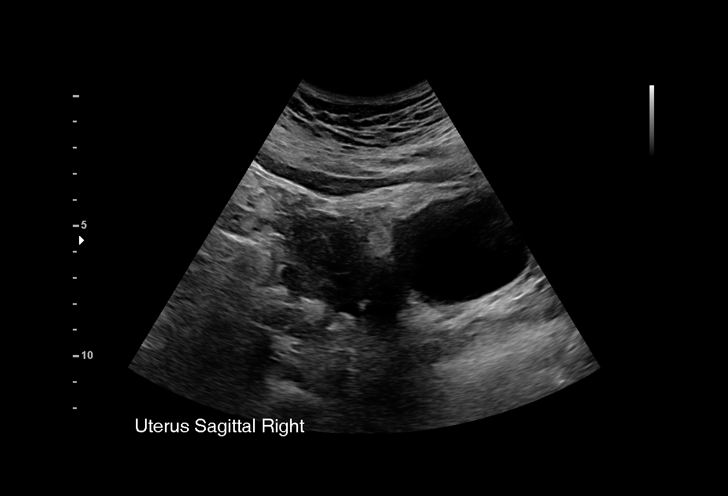
[im 11/70]
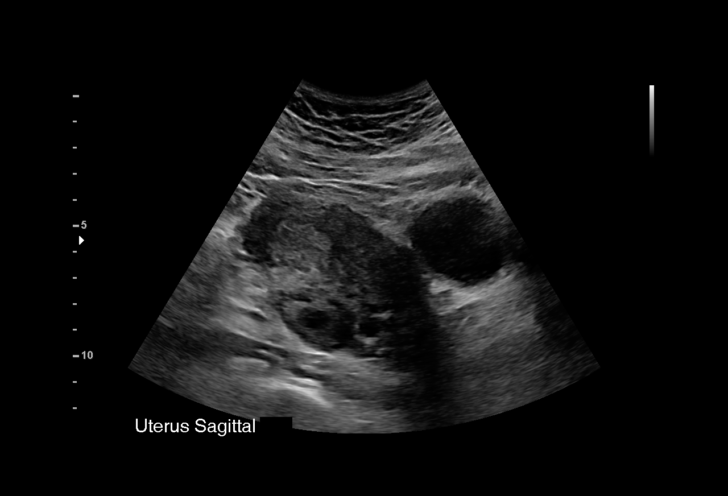
[im 16/70]
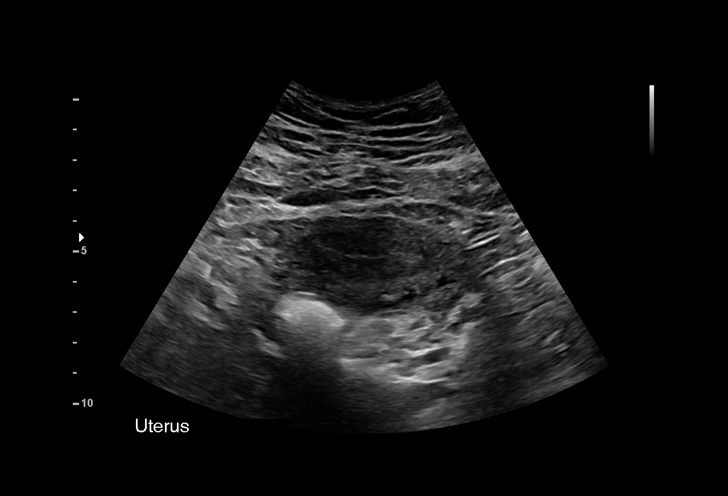
[im 21/70]
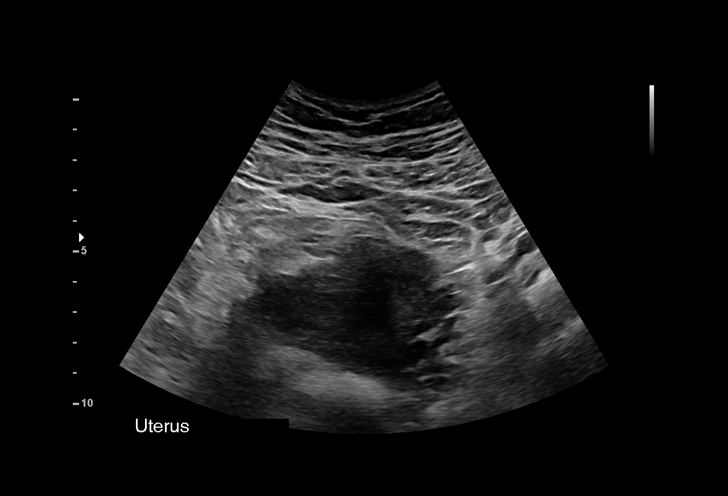
[im 26/70]
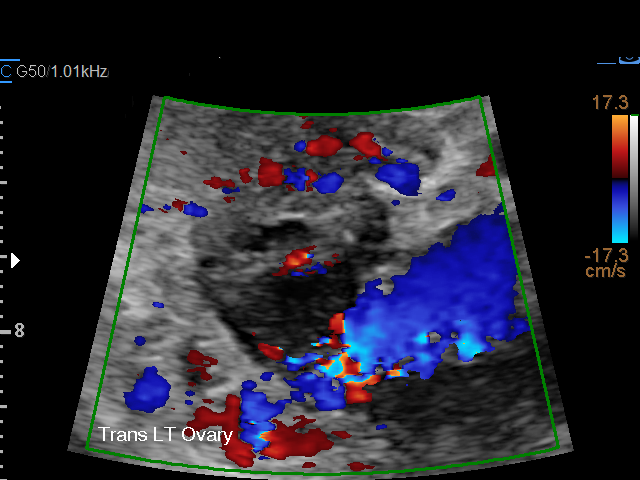
[im 31/70]
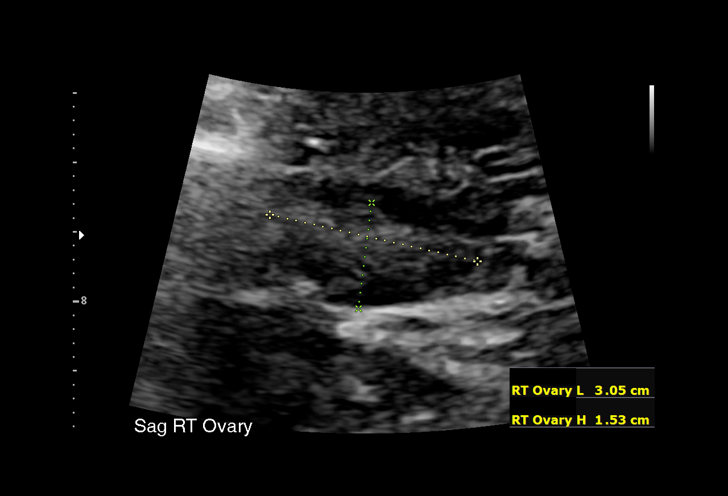
[im 36/70]
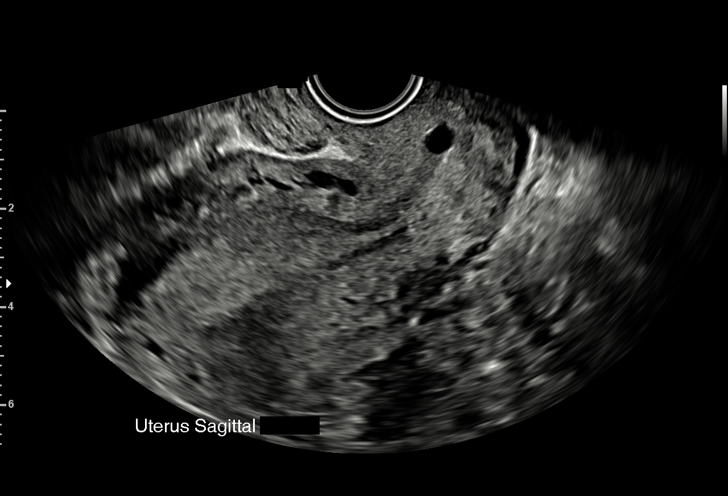
[im 39/70]
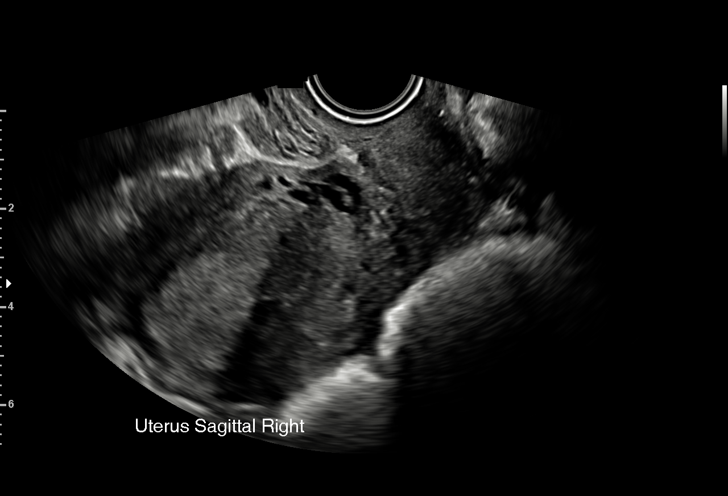
[im 44/70]
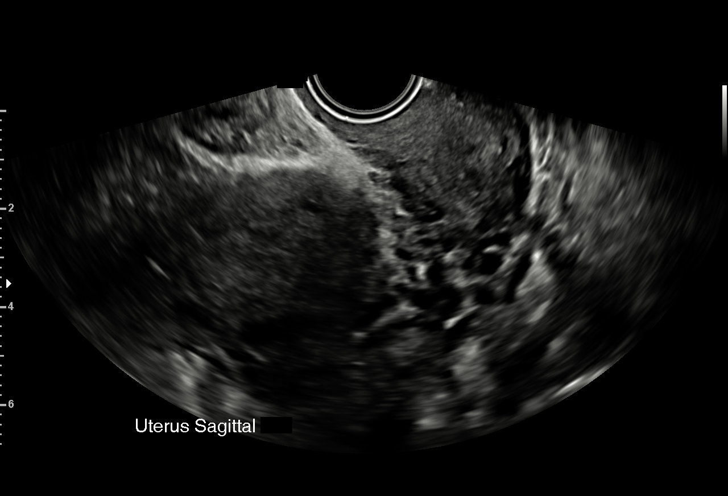
[im 49/70]
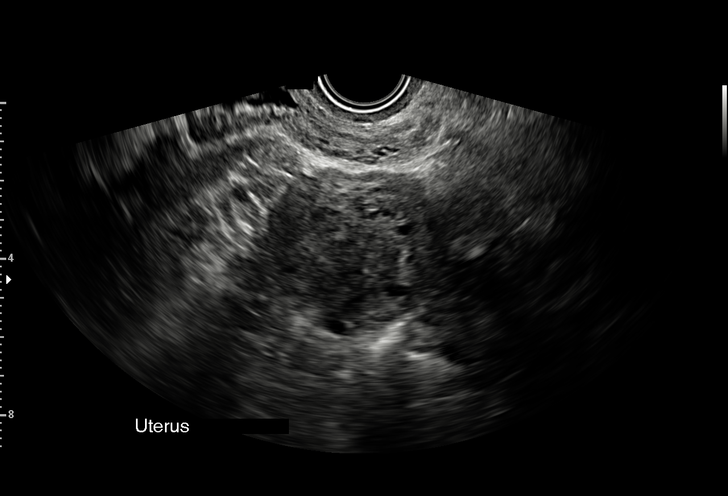
[im 54/70]
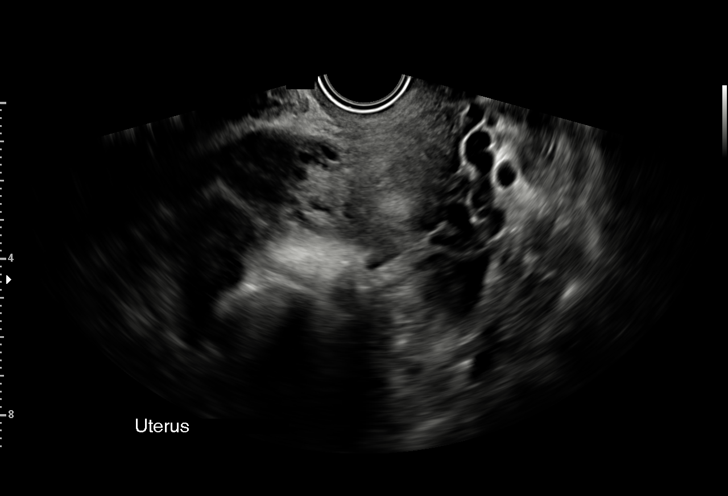
[im 59/70]
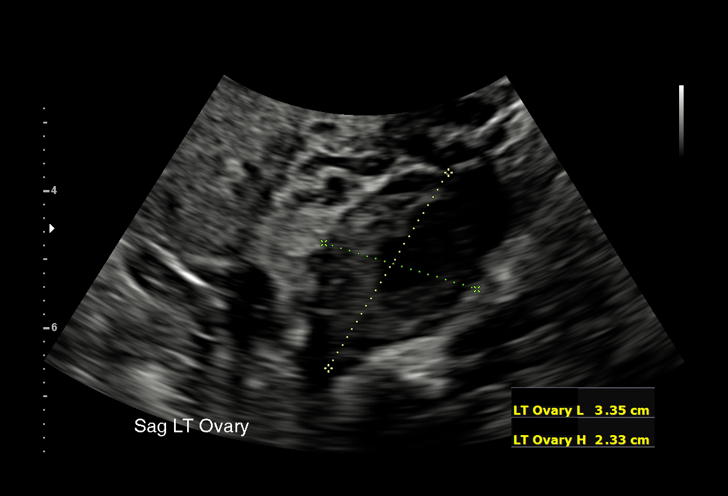
[im 64/70]
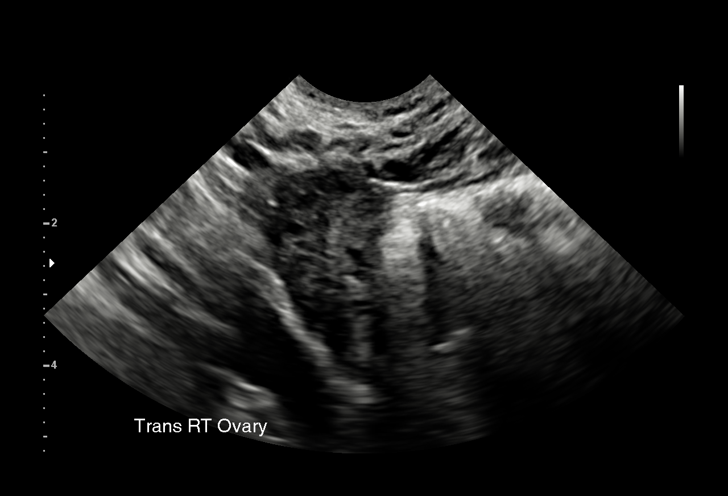
[im 70/70]
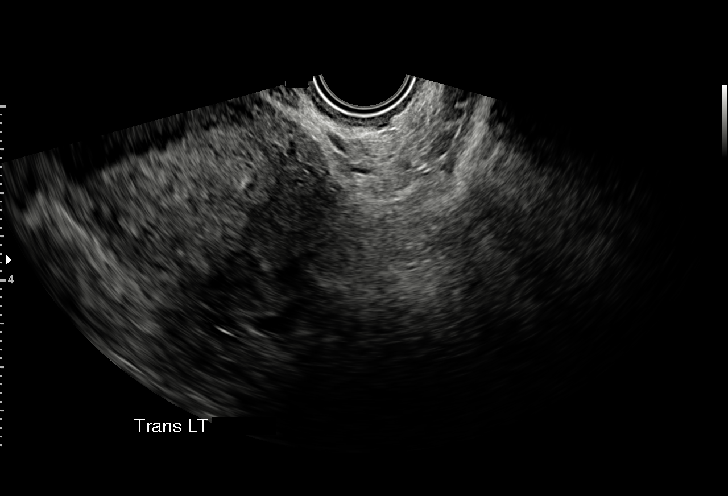

[15 of 28 positions shown; findings below may reference images not displayed]

FINDINGS: Intrauterine gestational sac: Absent

Maternal uterus/adnexae: Uterus and ovaries are well visualized and
within normal limits. Endometrium measures 16 mm. Minimal free fluid
is noted.
IMPRESSION: No evidence of intrauterine or extrauterine gestational sac.
Correlate with serial beta HCG levels. Follow-up ultrasound can be
performed as clinically indicated.

## 2021-08-26 DIAGNOSIS — Z3482 Encounter for supervision of other normal pregnancy, second trimester: Secondary | ICD-10-CM | POA: Diagnosis not present

## 2021-08-26 DIAGNOSIS — R69 Illness, unspecified: Secondary | ICD-10-CM | POA: Diagnosis not present

## 2021-08-26 DIAGNOSIS — Z23 Encounter for immunization: Secondary | ICD-10-CM | POA: Diagnosis not present

## 2021-08-26 DIAGNOSIS — O99012 Anemia complicating pregnancy, second trimester: Secondary | ICD-10-CM | POA: Diagnosis not present

## 2021-08-26 DIAGNOSIS — Z113 Encounter for screening for infections with a predominantly sexual mode of transmission: Secondary | ICD-10-CM | POA: Diagnosis not present

## 2021-08-26 DIAGNOSIS — O09899 Supervision of other high risk pregnancies, unspecified trimester: Secondary | ICD-10-CM | POA: Diagnosis not present

## 2021-08-26 LAB — OB RESULTS CONSOLE HEPATITIS B SURFACE ANTIGEN: Hepatitis B Surface Ag: NEGATIVE

## 2021-08-26 LAB — OB RESULTS CONSOLE HIV ANTIBODY (ROUTINE TESTING): HIV: NONREACTIVE

## 2021-08-26 LAB — OB RESULTS CONSOLE GC/CHLAMYDIA
Chlamydia: NEGATIVE
Neisseria Gonorrhea: NEGATIVE

## 2021-08-26 LAB — HEPATITIS C ANTIBODY: HCV Ab: NEGATIVE

## 2021-08-26 LAB — OB RESULTS CONSOLE RUBELLA ANTIBODY, IGM: Rubella: IMMUNE

## 2021-09-09 DIAGNOSIS — O09899 Supervision of other high risk pregnancies, unspecified trimester: Secondary | ICD-10-CM | POA: Diagnosis not present

## 2021-09-09 DIAGNOSIS — O99212 Obesity complicating pregnancy, second trimester: Secondary | ICD-10-CM | POA: Diagnosis not present

## 2021-09-09 DIAGNOSIS — Z23 Encounter for immunization: Secondary | ICD-10-CM | POA: Diagnosis not present

## 2021-09-09 DIAGNOSIS — O99012 Anemia complicating pregnancy, second trimester: Secondary | ICD-10-CM | POA: Diagnosis not present

## 2021-09-23 DIAGNOSIS — O99212 Obesity complicating pregnancy, second trimester: Secondary | ICD-10-CM | POA: Diagnosis not present

## 2021-09-23 DIAGNOSIS — Z3483 Encounter for supervision of other normal pregnancy, third trimester: Secondary | ICD-10-CM | POA: Diagnosis not present

## 2021-09-23 DIAGNOSIS — O99012 Anemia complicating pregnancy, second trimester: Secondary | ICD-10-CM | POA: Diagnosis not present

## 2021-09-23 DIAGNOSIS — O09899 Supervision of other high risk pregnancies, unspecified trimester: Secondary | ICD-10-CM | POA: Diagnosis not present

## 2021-09-30 DIAGNOSIS — B3731 Acute candidiasis of vulva and vagina: Secondary | ICD-10-CM | POA: Diagnosis not present

## 2021-09-30 DIAGNOSIS — Z3483 Encounter for supervision of other normal pregnancy, third trimester: Secondary | ICD-10-CM | POA: Diagnosis not present

## 2021-09-30 DIAGNOSIS — R69 Illness, unspecified: Secondary | ICD-10-CM | POA: Diagnosis not present

## 2021-09-30 DIAGNOSIS — Z113 Encounter for screening for infections with a predominantly sexual mode of transmission: Secondary | ICD-10-CM | POA: Diagnosis not present

## 2021-10-07 LAB — OB RESULTS CONSOLE GBS: GBS: POSITIVE

## 2021-10-20 ENCOUNTER — Encounter: Payer: Self-pay | Admitting: Advanced Practice Midwife

## 2021-10-20 ENCOUNTER — Inpatient Hospital Stay (HOSPITAL_COMMUNITY)
Admission: AD | Admit: 2021-10-20 | Discharge: 2021-10-21 | Disposition: A | Discharge status: Home / Self Care | Payer: Self-pay | Attending: Obstetrics and Gynecology | Admitting: Obstetrics and Gynecology

## 2021-10-20 DIAGNOSIS — Z3A39 39 weeks gestation of pregnancy: Secondary | ICD-10-CM | POA: Insufficient documentation

## 2021-10-20 DIAGNOSIS — O471 False labor at or after 37 completed weeks of gestation: Secondary | ICD-10-CM | POA: Insufficient documentation

## 2021-10-20 MED ORDER — HYDROCODONE-ACETAMINOPHEN 5-325 MG PO TABS
1.0000 | ORAL_TABLET | Freq: Once | ORAL | Status: AC
  Administered 2021-10-21: 1 via ORAL
  Filled 2021-10-20: qty 1

## 2021-10-20 NOTE — MAU Note (Signed)
..  Frost is a 31 y.o. at [redacted]w[redacted]d here in MAU reporting: contractions that began around 6pm that are now every 5 minutes. Denies vaginal bleeding or leaking of fluid. +FM.   Pain score: 8/10 Vitals:   10/20/21 2225  BP: 127/77  Pulse: 87  Resp: 20  Temp: 98.1 F (36.7 C)  SpO2: 99%     TDD:UKGURKY in room 130's

## 2021-10-20 NOTE — Progress Notes (Cosign Needed)
S: Patient is here for RN labor evaluation. Fetal tracing, vital signs, & chart reviewed   O:  Vitals:   10/20/21 2225  BP: 127/77  Pulse: 87  Resp: 20  Temp: 98.1 F (36.7 C)  TempSrc: Oral  SpO2: 99%  Weight: 104.6 kg  Height: 5\' 2"  (1.575 m)   No results found for this or any previous visit (from the past 24 hour(s)).  Dilation: 3 Effacement (%): Thick Station: -3 Presentation: Vertex Exam by:: Nikki R, RNC  FHR: 140 bpm, Mod Var, no Decels, present Accels UC: 6-8 min   A: 1. False labor after 37 completed weeks of gestation      P:  Cervix unchanged after one hour. Will give one time dose of norco 5mg  for pain control. Has f/u with prenatal provider tomorrow, goes to [facility]. RN to discharge home in stable condition with return precautions & fetal kick counts  Cecilio Asper, MD 11:51 PM  GME ATTESTATION:  I saw and evaluated the patient. I agree with the findings and the plan of care as documented in the resident's note. I have made changes to documentation as necessary.  Gerlene Fee, DO OB Fellow, [place] for [place] 10/21/2021, 1:25 AM

## 2021-10-21 ENCOUNTER — Encounter (HOSPITAL_COMMUNITY): Payer: Self-pay | Admitting: Obstetrics & Gynecology

## 2021-10-21 ENCOUNTER — Inpatient Hospital Stay (HOSPITAL_COMMUNITY)
Admission: AD | Admit: 2021-10-21 | Discharge: 2021-10-21 | Disposition: A | Discharge status: Home / Self Care | Payer: Self-pay | Attending: Obstetrics & Gynecology | Admitting: Obstetrics & Gynecology

## 2021-10-21 DIAGNOSIS — O479 False labor, unspecified: Secondary | ICD-10-CM

## 2021-10-21 DIAGNOSIS — O471 False labor at or after 37 completed weeks of gestation: Secondary | ICD-10-CM | POA: Insufficient documentation

## 2021-10-21 DIAGNOSIS — Z3A39 39 weeks gestation of pregnancy: Secondary | ICD-10-CM | POA: Insufficient documentation

## 2021-10-21 LAB — POCT FERN TEST: POCT Fern Test: NEGATIVE

## 2021-10-21 MED ORDER — HYDROCODONE-ACETAMINOPHEN 5-325 MG PO TABS
1.0000 | ORAL_TABLET | Freq: Once | ORAL | Status: AC
  Administered 2021-10-21: 1 via ORAL
  Filled 2021-10-21: qty 1

## 2021-10-21 NOTE — Progress Notes (Signed)
S: Ms. Carolyn Gomez is a 31 y.o. [phone] at [redacted]w[redacted]d  who presents to MAU today complaining contractions q 5 minutes since 1800. She denies vaginal bleeding. She endorses LOF. She reports normal fetal movement.    O: BP 123/74   Pulse 98   Temp 98.2 F (36.8 C) (Oral)   Resp 18   Ht 5\' 2"  (1.575 m)   Wt 106.6 kg   SpO2 98%   BMI 43.00 kg/m   Cervical exam:  Dilation: 4 Effacement (%): 50 Cervical Position: Posterior Station: -3 Presentation: Vertex Exam by:: Youlanda Roys, RN  Fetal Monitoring: Baseline: 130bpm Variability: moderate Accelerations: present Decelerations: absent Contractions: q7 mins, irritability   A: SIUP at [redacted]w[redacted]d  False labor w/ negative fern  P: Labor/Return precautions and norco x1  Autry-Lott, Norvel Wenker, DO 10/21/2021 9:45 PM

## 2021-10-21 NOTE — MAU Note (Addendum)
.  Carolyn Gomez is a 31 y.o. at [redacted]w[redacted]d here in MAU reporting: Ctx since yesterday, more intense and frequency since 1800 today. Pt states she was leaking some fluid twice earlier today with a ctx while she was bending over but unsure if it was pee, no leaking since. Pt denies VB, DFM, PIH s/s, and complications in the pregnancy.   Onset of complaint: 1800 Pain score: 10/10 There were no vitals filed for this visit.   FHT:135 Lab orders placed from triage:

## 2021-10-22 ENCOUNTER — Inpatient Hospital Stay (HOSPITAL_COMMUNITY): Payer: Medicaid Other | Admitting: Anesthesiology

## 2021-10-22 ENCOUNTER — Encounter (HOSPITAL_COMMUNITY): Payer: Self-pay | Admitting: Obstetrics & Gynecology

## 2021-10-22 ENCOUNTER — Other Ambulatory Visit: Payer: Self-pay

## 2021-10-22 ENCOUNTER — Inpatient Hospital Stay (HOSPITAL_COMMUNITY)
Admission: AD | Admit: 2021-10-22 | Discharge: 2021-10-23 | DRG: 807 | Disposition: A | Discharge status: Home / Self Care | Payer: Medicaid Other | Attending: Obstetrics and Gynecology | Admitting: Obstetrics and Gynecology

## 2021-10-22 DIAGNOSIS — O99824 Streptococcus B carrier state complicating childbirth: Secondary | ICD-10-CM | POA: Diagnosis present

## 2021-10-22 DIAGNOSIS — Z3A39 39 weeks gestation of pregnancy: Secondary | ICD-10-CM

## 2021-10-22 DIAGNOSIS — O99214 Obesity complicating childbirth: Principal | ICD-10-CM | POA: Diagnosis present

## 2021-10-22 DIAGNOSIS — F53 Postpartum depression: Secondary | ICD-10-CM | POA: Diagnosis present

## 2021-10-22 DIAGNOSIS — Z789 Other specified health status: Secondary | ICD-10-CM | POA: Diagnosis present

## 2021-10-22 DIAGNOSIS — O99820 Streptococcus B carrier state complicating pregnancy: Secondary | ICD-10-CM

## 2021-10-22 DIAGNOSIS — Z8759 Personal history of other complications of pregnancy, childbirth and the puerperium: Secondary | ICD-10-CM

## 2021-10-22 DIAGNOSIS — O26893 Other specified pregnancy related conditions, third trimester: Secondary | ICD-10-CM | POA: Diagnosis present

## 2021-10-22 LAB — CBC
HCT: 32.3 % — ABNORMAL LOW (ref 36.0–46.0)
Hemoglobin: 10.5 g/dL — ABNORMAL LOW (ref 12.0–15.0)
MCH: 26.6 pg (ref 26.0–34.0)
MCHC: 32.5 g/dL (ref 30.0–36.0)
MCV: 82 fL (ref 80.0–100.0)
Platelets: 365 10*3/uL (ref 150–400)
RBC: 3.94 MIL/uL (ref 3.87–5.11)
RDW: 14.6 % (ref 11.5–15.5)
WBC: 10.6 10*3/uL — ABNORMAL HIGH (ref 4.0–10.5)
nRBC: 0 % (ref 0.0–0.2)

## 2021-10-22 LAB — TYPE AND SCREEN
ABO/RH(D): O POS
Antibody Screen: NEGATIVE

## 2021-10-22 LAB — RPR: RPR Ser Ql: NONREACTIVE

## 2021-10-22 MED ORDER — OXYCODONE-ACETAMINOPHEN 5-325 MG PO TABS: 2.0000 | ORAL_TABLET | ORAL | Status: DC | PRN

## 2021-10-22 MED ORDER — FLEET ENEMA 7-19 GM/118ML RE ENEM: 1.0000 | ENEMA | RECTAL | Status: DC | PRN

## 2021-10-22 MED ORDER — ONDANSETRON HCL 4 MG PO TABS: 4.0000 mg | ORAL_TABLET | ORAL | Status: DC | PRN

## 2021-10-22 MED ORDER — ONDANSETRON HCL 4 MG/2ML IJ SOLN
4.0000 mg | Freq: Four times a day (QID) | INTRAMUSCULAR | Status: DC | PRN
  Administered 2021-10-22: 4 mg via INTRAVENOUS
  Filled 2021-10-22: qty 2

## 2021-10-22 MED ORDER — SOD CITRATE-CITRIC ACID 500-334 MG/5ML PO SOLN: 30.0000 mL | ORAL | Status: DC | PRN

## 2021-10-22 MED ORDER — HYDROXYZINE HCL 50 MG PO TABS: 50.0000 mg | ORAL_TABLET | Freq: Four times a day (QID) | ORAL | Status: DC | PRN

## 2021-10-22 MED ORDER — BENZOCAINE-MENTHOL 20-0.5 % EX AERO
1.0000 | INHALATION_SPRAY | CUTANEOUS | Status: DC | PRN
  Administered 2021-10-22: 1 via TOPICAL
  Filled 2021-10-22: qty 56

## 2021-10-22 MED ORDER — FENTANYL CITRATE (PF) 100 MCG/2ML IJ SOLN: 50.0000 ug | INTRAMUSCULAR | Status: DC | PRN

## 2021-10-22 MED ORDER — TERBUTALINE SULFATE 1 MG/ML IJ SOLN: 0.2500 mg | Freq: Once | INTRAMUSCULAR | Status: DC | PRN

## 2021-10-22 MED ORDER — SIMETHICONE 80 MG PO CHEW: 80.0000 mg | CHEWABLE_TABLET | ORAL | Status: DC | PRN

## 2021-10-22 MED ORDER — LACTATED RINGERS IV SOLN: INTRAVENOUS | Status: DC

## 2021-10-22 MED ORDER — IBUPROFEN 600 MG PO TABS
600.0000 mg | ORAL_TABLET | Freq: Four times a day (QID) | ORAL | Status: DC
  Administered 2021-10-22 – 2021-10-23 (×4): 600 mg via ORAL
  Filled 2021-10-22 (×4): qty 1

## 2021-10-22 MED ORDER — LACTATED RINGERS IV SOLN: 500.0000 mL | Freq: Once | INTRAVENOUS | Status: DC

## 2021-10-22 MED ORDER — FENTANYL-BUPIVACAINE-NACL 0.5-0.125-0.9 MG/250ML-% EP SOLN
12.0000 mL/h | EPIDURAL | Status: DC | PRN
  Administered 2021-10-22: 12 mL/h via EPIDURAL
  Filled 2021-10-22: qty 250

## 2021-10-22 MED ORDER — EPHEDRINE 5 MG/ML INJ: 10.0000 mg | INTRAVENOUS | Status: DC | PRN

## 2021-10-22 MED ORDER — OXYTOCIN-SODIUM CHLORIDE 30-0.9 UT/500ML-% IV SOLN
1.0000 m[IU]/min | INTRAVENOUS | Status: DC
  Administered 2021-10-22: 2 m[IU]/min via INTRAVENOUS

## 2021-10-22 MED ORDER — DIPHENHYDRAMINE HCL 50 MG/ML IJ SOLN: 12.5000 mg | INTRAMUSCULAR | Status: DC | PRN

## 2021-10-22 MED ORDER — DIBUCAINE (PERIANAL) 1 % EX OINT: 1.0000 | TOPICAL_OINTMENT | CUTANEOUS | Status: DC | PRN

## 2021-10-22 MED ORDER — DIPHENHYDRAMINE HCL 25 MG PO CAPS: 25.0000 mg | ORAL_CAPSULE | Freq: Four times a day (QID) | ORAL | Status: DC | PRN

## 2021-10-22 MED ORDER — SODIUM CHLORIDE 0.9 % IV SOLN
1.0000 g | INTRAVENOUS | Status: DC
  Administered 2021-10-22: 1 g via INTRAVENOUS
  Filled 2021-10-22: qty 1000

## 2021-10-22 MED ORDER — SODIUM CHLORIDE 0.9 % IV SOLN: 250.0000 mL | INTRAVENOUS | Status: DC | PRN

## 2021-10-22 MED ORDER — WITCH HAZEL-GLYCERIN EX PADS: 1.0000 | MEDICATED_PAD | CUTANEOUS | Status: DC | PRN

## 2021-10-22 MED ORDER — SODIUM CHLORIDE 0.9% FLUSH
3.0000 mL | Freq: Two times a day (BID) | INTRAVENOUS | Status: DC
  Administered 2021-10-22: 3 mL via INTRAVENOUS

## 2021-10-22 MED ORDER — OXYCODONE HCL 5 MG PO TABS
5.0000 mg | ORAL_TABLET | Freq: Once | ORAL | Status: AC
  Administered 2021-10-22: 5 mg via ORAL
  Filled 2021-10-22: qty 1

## 2021-10-22 MED ORDER — ZOLPIDEM TARTRATE 5 MG PO TABS: 5.0000 mg | ORAL_TABLET | Freq: Every evening | ORAL | Status: DC | PRN

## 2021-10-22 MED ORDER — OXYTOCIN-SODIUM CHLORIDE 30-0.9 UT/500ML-% IV SOLN
2.5000 [IU]/h | INTRAVENOUS | Status: DC
  Filled 2021-10-22: qty 500

## 2021-10-22 MED ORDER — PRENATAL MULTIVITAMIN CH
1.0000 | ORAL_TABLET | Freq: Every day | ORAL | Status: DC
  Administered 2021-10-22 – 2021-10-23 (×2): 1 via ORAL
  Filled 2021-10-22 (×2): qty 1

## 2021-10-22 MED ORDER — OXYCODONE-ACETAMINOPHEN 5-325 MG PO TABS: 1.0000 | ORAL_TABLET | ORAL | Status: DC | PRN

## 2021-10-22 MED ORDER — LIDOCAINE HCL (PF) 1 % IJ SOLN: 30.0000 mL | INTRAMUSCULAR | Status: DC | PRN

## 2021-10-22 MED ORDER — LACTATED RINGERS IV SOLN: 500.0000 mL | INTRAVENOUS | Status: DC | PRN

## 2021-10-22 MED ORDER — OXYTOCIN BOLUS FROM INFUSION
333.0000 mL | Freq: Once | INTRAVENOUS | Status: AC
  Administered 2021-10-22: 333 mL via INTRAVENOUS

## 2021-10-22 MED ORDER — FENTANYL-BUPIVACAINE-NACL 0.5-0.125-0.9 MG/250ML-% EP SOLN: 12.0000 mL/h | EPIDURAL | Status: DC | PRN

## 2021-10-22 MED ORDER — LIDOCAINE-EPINEPHRINE (PF) 2 %-1:200000 IJ SOLN
INTRAMUSCULAR | Status: DC | PRN
  Administered 2021-10-22: 5 mL via EPIDURAL

## 2021-10-22 MED ORDER — PHENYLEPHRINE 80 MCG/ML (10ML) SYRINGE FOR IV PUSH (FOR BLOOD PRESSURE SUPPORT): 80.0000 ug | PREFILLED_SYRINGE | INTRAVENOUS | Status: DC | PRN

## 2021-10-22 MED ORDER — SODIUM CHLORIDE 0.9% FLUSH: 3.0000 mL | INTRAVENOUS | Status: DC | PRN

## 2021-10-22 MED ORDER — SENNOSIDES-DOCUSATE SODIUM 8.6-50 MG PO TABS
2.0000 | ORAL_TABLET | ORAL | Status: DC
  Administered 2021-10-22 – 2021-10-23 (×2): 2 via ORAL
  Filled 2021-10-22: qty 2

## 2021-10-22 MED ORDER — MEASLES, MUMPS & RUBELLA VAC IJ SOLR: 0.5000 mL | Freq: Once | INTRAMUSCULAR | Status: DC

## 2021-10-22 MED ORDER — SODIUM CHLORIDE 0.9 % IV SOLN
2.0000 g | Freq: Once | INTRAVENOUS | Status: AC
  Administered 2021-10-22: 2 g via INTRAVENOUS
  Filled 2021-10-22: qty 2000

## 2021-10-22 MED ORDER — TETANUS-DIPHTH-ACELL PERTUSSIS 5-2.5-18.5 LF-MCG/0.5 IM SUSY: 0.5000 mL | PREFILLED_SYRINGE | Freq: Once | INTRAMUSCULAR | Status: DC

## 2021-10-22 MED ORDER — COCONUT OIL OIL: 1.0000 | TOPICAL_OIL | Status: DC | PRN

## 2021-10-22 MED ORDER — ACETAMINOPHEN 325 MG PO TABS
650.0000 mg | ORAL_TABLET | ORAL | Status: DC | PRN
  Administered 2021-10-22 – 2021-10-23 (×3): 650 mg via ORAL
  Filled 2021-10-22 (×3): qty 2

## 2021-10-22 MED ORDER — ONDANSETRON HCL 4 MG/2ML IJ SOLN: 4.0000 mg | INTRAMUSCULAR | Status: DC | PRN

## 2021-10-22 MED ORDER — ACETAMINOPHEN 325 MG PO TABS
650.0000 mg | ORAL_TABLET | ORAL | Status: DC | PRN
  Administered 2021-10-22: 650 mg via ORAL
  Filled 2021-10-22: qty 2

## 2021-10-22 NOTE — H&P (Signed)
Carolyn Gomez is a 31 y.o. female [phone] with IUP at [redacted]w[redacted]d by LMP presenting for labor  She reports positive fetal movement. She denies leakage of fluid or vaginal bleeding.  Prenatal History/Complications: PNC at [facility] HX:  ICP, late to care, short interval pregnancy, LGA, anemia, Hx PPH, Hx preterm delivery Pregnancy complications:  - Past Medical History: Past Medical History:  Diagnosis Date   Anemia    Medical history non-contributory     Past Surgical History: Past Surgical History:  Procedure Laterality Date   NO PAST SURGERIES      Obstetrical History: OB History     Gravida  4   Para  3   Term  2   Preterm  1   AB  0   Living  3      SAB  0   IAB  0   Ectopic  0   Multiple  0   Live Births  2        Obstetric Comments  Per patient, patient had anemia in last pregnancy and required transfusion after delivery. She reports previously her anemia was 'severe' and required 1 unit pRBC postpartum.   Postpartum she reports she had some heavy bleeding but this resolved.           Social History: Social History   Socioeconomic History   Marital status: Significant Other    Spouse name: Not on file   Number of children: Not on file   Years of education: Not on file   Highest education level: Not on file  Occupational History   Not on file  Tobacco Use   Smoking status: Never   Smokeless tobacco: Never  Vaping Use   Vaping Use: Never used  Substance and Sexual Activity   Alcohol use: Never    Comment: occasionally drink a beer   Drug use: Never   Sexual activity: Yes    Birth control/protection: Injection  Other Topics Concern   Not on file  Social History Narrative   Not on file   Social Determinants of Health   Financial Resource Strain: Not on file  Food Insecurity: No Food Insecurity (09/16/2020)   Hunger Vital Sign    Worried About Running Out of Food in the Last Year: Never true    Ran Out of Food in the Last  Year: Never true  Transportation Needs: No Transportation Needs (09/16/2020)   PRAPARE - Administrator, Civil Service (Medical): No    Lack of Transportation (Non-Medical): No  Physical Activity: Not on file  Stress: Not on file  Social Connections: Not on file    Family History: Family History  Problem Relation Age of Onset   Cancer Neg Hx    Diabetes Neg Hx    Hyperlipidemia Neg Hx    Hypertension Neg Hx     Allergies: No Known Allergies  Medications Prior to Admission  Medication Sig Dispense Refill Last Dose   acetaminophen (TYLENOL) 325 MG tablet Take 1 tablet (325 mg total) by mouth every 4 (four) hours as needed (for pain scale < 4). 30 tablet 0    ferrous sulfate 325 (65 FE) MG tablet TAKE 1 TABLET BY MOUTH EVERY OTHER DAY 45 tablet 1 Unknown   ibuprofen (ADVIL) 600 MG tablet Take 1 tablet (600 mg total) by mouth every 6 (six) hours. 30 tablet 0 Unknown   norethindrone (MICRONOR) 0.35 MG tablet TAKE 1 TABLET (0.35 MG TOTAL) BY MOUTH DAILY. TAKE  AT THE SAME TIME EVERY DAY. 84 tablet 3    Prenatal Vit-Fe Fumarate-FA (MULTIVITAMIN-PRENATAL) 27-0.8 MG TABS tablet Take 1 tablet by mouth daily at 12 noon.      SUMAtriptan (IMITREX) 50 MG tablet Take 1 tablet (50 mg total) by mouth once for 1 dose. May repeat in 2 hours if headache persists or recurs. 10 tablet 1     Review of Systems   Constitutional: Negative for fever and chills Eyes: Negative for visual disturbances Respiratory: Negative for shortness of breath, dyspnea Cardiovascular: Negative for chest pain or palpitations  Gastrointestinal: Negative for vomiting, diarrhea and constipation.  POSITIVE for abdominal pain (contractions) Genitourinary: Negative for dysuria and urgency Musculoskeletal: Negative for back pain, joint pain, myalgias  Neurological: Negative for dizziness and headaches  Blood pressure 113/65, pulse 84, temperature 98.2 F (36.8 C), temperature source Oral, resp. rate 17, SpO2 98 %,  currently breastfeeding. General appearance: alert, cooperative, and no distress Lungs: normal respiratory effort Heart: regular rate and rhythm Abdomen: soft, non-tender; bowel sounds normal Extremities: Homans sign is negative, no sign of DVT DTR's 2+ Presentation: cephalic Fetal monitoring  Baseline: 140 bpm, Variability: Good {> 6 bpm), Accelerations: Reactive, and Decelerations: Absent Uterine activity  2-3 Dilation: 6.5 Effacement (%): 90 Station: -1 Exam by:: K.Louis-Charles, RN   Prenatal labs: ABO, Rh: --/--/O POS (10/20 0246) Antibody: NEG (10/20 0246) Rubella:  immune RPR:   neg HBsAg:   neg HIV:   neg GBS:   + 1 hr Glucola 127 Genetic screening   Anatomy US normal boy  Prenatal Transfer Tool  Maternal Diabetes: No Genetic Screening: Normal Maternal Ultrasounds/Referrals: Normal Fetal Ultrasounds or other Referrals:  None Maternal Substance Abuse:  No Significant Maternal Medications:  None Significant Maternal Lab Results: Group B Strep positive  Results for orders placed or performed during the hospital encounter of 10/22/21 (from the past 24 hour(s))  CBC   Collection Time: 10/22/21  2:46 AM  Result Value Ref Range   WBC 10.6 (H) 4.0 - 10.5 K/uL   RBC 3.94 3.87 - 5.11 MIL/uL   Hemoglobin 10.5 (L) 12.0 - 15.0 g/dL   HCT 32.3 (L) 36.0 - 46.0 %   MCV 82.0 80.0 - 100.0 fL   MCH 26.6 26.0 - 34.0 pg   MCHC 32.5 30.0 - 36.0 g/dL   RDW 14.6 11.5 - 15.5 %   Platelets 365 150 - 400 K/uL   nRBC 0.0 0.0 - 0.2 %  Type and screen [place]   Collection Time: 10/22/21  2:46 AM  Result Value Ref Range   ABO/RH(D) O POS    Antibody Screen NEG    Sample Expiration      10/25/2021,2359 Performed at [facility], [address], [place], [place] [address]   Results for orders placed or performed during the hospital encounter of 10/21/21 (from the past 24 hour(s))  POCT fern test   Collection Time: 10/21/21  7:30 PM  Result Value Ref  Range   POCT Fern Test Negative = intact amniotic membranes     Assessment: Carolyn Gomez is a 31 y.o. [phone] with an IUP at [redacted]w[redacted]d presenting for active labor  Plan: #Labor: expectant management #Pain:  Per request #FWB Cat 1 #ID: GBS: Ampicillin  #MOF:  both #MOC: unsure #Circ: no   Carolyn Gomez 10/22/2021, 4:55 AM

## 2021-10-22 NOTE — MAU Note (Signed)
.  Carolyn Gomez is a 31 y.o. at [redacted]w[redacted]d here in MAU reporting: Pt was her in MAU earlier, Pt states that Ctx became stronger coming every 2 min. No LOF but does report bloody show.  +FM.  Pain score: 10/10 ctx pain Vitals:   10/22/21 0215  BP: 118/76  Pulse: 84  Resp: 19  Temp: 97.8 F (36.6 C)  SpO2: 98%     FHT:142 Lab orders placed from triage:   labor eval

## 2021-10-22 NOTE — Lactation Note (Signed)
This note was copied from a baby's chart. Lactation Consultation Note  Patient Name: Carolyn Gomez [id number] Date: 10/22/2021 - Age - 92 mins PP  Reason for consult: L&D Initial assessment (LC L/D visit less than 60 mins. Baby STS on moms chest. per [facility] RN baby had already fed and mom confirmed it was 10 mins. Mom is experienced BF of 5 months. [facility] S interpreter busy. Vonna Kotyk #[id number])    Maternal Data Does the patient have breastfeeding experience prior to this delivery?: Yes How long did the patient breastfeed?: per mom % months  Feeding Mother's Current Feeding Choice: Breast Milk and Formula  LATCH Score - Latched prior to [facility]       Consult Status Consult Status: Follow-up from L&D Date: 10/22/21 Follow-up type: In-patient    [place] 10/22/2021, 12:41 PM

## 2021-10-22 NOTE — Anesthesia Procedure Notes (Signed)

## 2021-10-22 NOTE — Anesthesia Preprocedure Evaluation (Signed)
Anesthesia Evaluation  Patient identified by MRN, date of birth, ID band Patient awake    Reviewed: Allergy & Precautions, NPO status , Patient's Chart, lab work & pertinent test results  Airway Mallampati: III  TM Distance: >3 FB Neck ROM: Full    Dental no notable dental hx.    Pulmonary neg pulmonary ROS,    Pulmonary exam normal breath sounds clear to auscultation       Cardiovascular negative cardio ROS Normal cardiovascular exam Rhythm:Regular Rate:Normal     Neuro/Psych  Headaches, PSYCHIATRIC DISORDERS Depression    GI/Hepatic negative GI ROS, Neg liver ROS,   Endo/Other  Morbid obesity (BMI 43)  Renal/GU negative Renal ROS  negative genitourinary   Musculoskeletal negative musculoskeletal ROS (+)   Abdominal   Peds  Hematology  (+) Blood dyscrasia, anemia ,   Anesthesia Other Findings   Reproductive/Obstetrics (+) Pregnancy                             Anesthesia Physical Anesthesia Plan  ASA: 3  Anesthesia Plan: Epidural   Post-op Pain Management:    Induction:   PONV Risk Score and Plan: Treatment may vary due to age or medical condition  Airway Management Planned: Natural Airway  Additional Equipment:   Intra-op Plan:   Post-operative Plan:   Informed Consent: I have reviewed the patients History and Physical, chart, labs and discussed the procedure including the risks, benefits and alternatives for the proposed anesthesia with the patient or authorized representative who has indicated his/her understanding and acceptance.       Plan Discussed with: Anesthesiologist  Anesthesia Plan Comments: (Patient identified. Risks, benefits, options discussed with patient including but not limited to bleeding, infection, nerve damage, paralysis, failed block, incomplete pain control, headache, blood pressure changes, nausea, vomiting, reactions to medication, itching, and  post partum back pain. Confirmed with bedside nurse the patient's most recent platelet count. Confirmed with the patient that they are not taking any anticoagulation, have any bleeding history or any family history of bleeding disorders. Patient expressed understanding and wishes to proceed. All questions were answered. )        Anesthesia Quick Evaluation

## 2021-10-22 NOTE — Progress Notes (Signed)
Labor Progress Note Carolyn Gomez is a 31 y.o. [phone] at [redacted]w[redacted]d presented for SOL  S: Feeling well with epidural  O:  BP 114/77 (BP Location: Right Arm)   Pulse 80   Temp 98.1 F (36.7 C) (Oral)   Resp 17   SpO2 98%  EFM: 130 bpm/Moderate variability/ 15x15 accels/ None decels   CVE: Dilation: 8 Effacement (%): 80 Cervical Position: Middle Station: -1 Presentation: Vertex Exam by:: Ronalee Belts RN and Adonis Brook RN   A&P: 31 y.o. [phone] [redacted]w[redacted]d Sol as abpve #Labor: Progressing well. Start pitocin to increase contraction pattern to q1-109min ctx #Pain: Epidural #FWB: CAT 1 #GBS positive  Carolyn Ripp Q Mercado-Ortiz, DO 11:18 AM

## 2021-10-22 NOTE — Discharge Summary (Signed)
Postpartum Discharge Summary  Date of Service updated***     Patient Name: Carolyn Gomez DOB: May 06, 1990 MRN: [id number]  Date of admission: 10/22/2021 Delivery date:10/22/2021  Delivering provider: Shelda Pal  Date of discharge: 10/22/2021  Admitting diagnosis: Indication for care in labor or delivery [O75.9] Intrauterine pregnancy: [redacted]w[redacted]d    Secondary diagnosis:  Principal Problem:   Vaginal delivery Active Problems:   Postpartum depression   Language barrier   History of postpartum hemorrhage   Indication for care in labor or delivery  Additional problems: ***    Discharge diagnosis: Term Pregnancy Delivered                                              Post partum procedures:{Postpartum procedures:23558} Augmentation: AROM and Pitocin Complications: None  Hospital course: Onset of Labor With Vaginal Delivery      31y.o. yo [phone] admitted in Latent Labor on 10/22/2021. Labor course was complicated bynone  Membrane Rupture Time/Date: 9:24 AM ,10/22/2021   Delivery Method:Vaginal, Spontaneous  Episiotomy: None  Lacerations:  None  Patient had a postpartum course complicated by ***.  She is ambulating, tolerating a regular diet, passing flatus, and urinating well. Patient is discharged home in stable condition on 10/22/21.  Newborn Data: Birth date:10/22/2021  Birth time:11:38 AM  Gender:Female  Living status:Living  Apgars:8 ,9  Weight:   Magnesium Sulfate received: {Mag received:30440022} BMZ received: No Rhophylac:N/A MMR:N/A T-DaP:{Tdap:23962} Flu: {F{VYX:21587}ransfusion:{Transfusion received:30440034}  Physical exam  Vitals:   10/22/21 1151 10/22/21 1202 10/22/21 1216 10/22/21 1220  BP: 110/81 115/64 (!) 142/85 (!) 142/85  Pulse: 85 88 (!) 113 (!) 113  Resp:      Temp:      TempSrc:      SpO2:       General: {Exam; general:21111117} Lochia: {Desc; appropriate/inappropriate:30686::"appropriate"} Uterine  Fundus: {Desc; firm/soft:30687} Incision: {Exam; incision:21111123} DVT Evaluation: {Exam; dvt:2111122} Labs: Lab Results  Component Value Date   WBC 10.6 (H) 10/22/2021   HGB 10.5 (L) 10/22/2021   HCT 32.3 (L) 10/22/2021   MCV 82.0 10/22/2021   PLT 365 10/22/2021      Latest Ref Rng & Units 10/26/2020    2:26 AM  CMP  Glucose 70 - 99 mg/dL 97   BUN 6 - 20 mg/dL 9   Creatinine 0.44 - 1.00 mg/dL 0.59   Sodium 135 - 145 mmol/L 138   Potassium 3.5 - 5.1 mmol/L 3.7   Chloride 98 - 111 mmol/L 109   CO2 22 - 32 mmol/L 23   Calcium 8.9 - 10.3 mg/dL 8.1   Total Protein 6.5 - 8.1 g/dL 5.9   Total Bilirubin 0.3 - 1.2 mg/dL 0.4   Alkaline Phos 38 - 126 U/L 86   AST 15 - 41 U/L 37   ALT 0 - 44 U/L 29    Edinburgh Score:    07/28/2019    8:00 AM  Edinburgh Postnatal Depression Scale Screening Tool  I have been able to laugh and see the funny side of things. 0  I have looked forward with enjoyment to things. 0  I have blamed myself unnecessarily when things went wrong. 0  I have been anxious or worried for no good reason. 0  I have felt scared or panicky for no good reason. 1  Things have been getting on  top of me. 3  I have been so unhappy that I have had difficulty sleeping. 0  I have felt sad or miserable. 0  I have been so unhappy that I have been crying. 0  The thought of harming myself has occurred to me. 0  Edinburgh Postnatal Depression Scale Total 4     After visit meds:  Allergies as of 10/22/2021   No Known Allergies   Med Rec must be completed prior to using this [facility]***        Discharge home in stable condition Infant Feeding: {Baby feeding:23562} Infant Disposition:{CHL IP OB HOME WITH ZOXWRU:04540} Discharge instruction: per After Visit Summary and Postpartum booklet. Activity: Advance as tolerated. Pelvic rest for 6 weeks.  Diet: {OB JWJX:91478295} Future Appointments:No future appointments.  Follow up Visit: Pt to f/u at  [facility]    10/22/2021 Shelda Pal, DO

## 2021-10-23 LAB — CBC
HCT: 29.1 % — ABNORMAL LOW (ref 36.0–46.0)
Hemoglobin: 9.5 g/dL — ABNORMAL LOW (ref 12.0–15.0)
MCH: 26.9 pg (ref 26.0–34.0)
MCHC: 32.6 g/dL (ref 30.0–36.0)
MCV: 82.4 fL (ref 80.0–100.0)
Platelets: 349 10*3/uL (ref 150–400)
RBC: 3.53 MIL/uL — ABNORMAL LOW (ref 3.87–5.11)
RDW: 15.1 % (ref 11.5–15.5)
WBC: 10.2 10*3/uL (ref 4.0–10.5)
nRBC: 0 % (ref 0.0–0.2)

## 2021-10-23 MED ORDER — ACETAMINOPHEN 325 MG PO TABS: 650.0000 mg | ORAL_TABLET | ORAL | Status: DC

## 2021-10-23 MED ORDER — ACETAMINOPHEN 325 MG PO TABS: 650.0000 mg | ORAL_TABLET | ORAL | 0 refills | Status: DC | PRN

## 2021-10-23 MED ORDER — WITCH HAZEL-GLYCERIN EX PADS: 1.0000 | MEDICATED_PAD | CUTANEOUS | 12 refills | Status: DC | PRN

## 2021-10-23 MED ORDER — HYDROCODONE-ACETAMINOPHEN 7.5-325 MG PO TABS: 1.0000 | ORAL_TABLET | Freq: Once | ORAL | Status: DC

## 2021-10-23 MED ORDER — BENZOCAINE-MENTHOL 20-0.5 % EX AERO: 1.0000 | INHALATION_SPRAY | CUTANEOUS | 0 refills | Status: DC | PRN

## 2021-10-23 MED ORDER — OXYCODONE-ACETAMINOPHEN 7.5-325 MG PO TABS
1.0000 | ORAL_TABLET | Freq: Once | ORAL | Status: AC
  Administered 2021-10-23: 1 via ORAL

## 2021-10-23 MED ORDER — FERROUS SULFATE 325 (65 FE) MG PO TABS: 325.0000 mg | ORAL_TABLET | ORAL | Status: DC

## 2021-10-23 MED ORDER — SENNOSIDES-DOCUSATE SODIUM 8.6-50 MG PO TABS: 2.0000 | ORAL_TABLET | ORAL | 0 refills | Status: DC

## 2021-10-23 MED ORDER — FERROUS SULFATE 325 (65 FE) MG PO TABS: 325.0000 mg | ORAL_TABLET | ORAL | 3 refills | Status: DC

## 2021-10-23 MED ORDER — IBUPROFEN 600 MG PO TABS: 600.0000 mg | ORAL_TABLET | Freq: Four times a day (QID) | ORAL | 0 refills | Status: DC

## 2021-10-23 NOTE — Lactation Note (Signed)
This note was copied from a baby's chart. Lactation Consultation Note  Patient Name: Carolyn Gomez [id number] Date: 10/23/2021 Reason for consult: Initial assessment;Term;Infant weight loss;Breastfeeding assistance (2.05% WL) Age:32 hours  Experienced breastfeeding Parent (P4)  In-house Spanish interpreter [place] used.  LC entered the room and the infant and infant was asleep at the breast.  Per the birth parent, things are going well with breastfeeding.  She denies any pain or discomfort with feedings. The birth parent stated that the infant was only getting a small amount of milk.  LC spoke with the birth parent about milk production in the early days of life. The birth parent stated that this is her 4th baby and she has breastfeeding experience.  LC encouraged the birth parent to bring the baby up to her breast so that she is not bending over to feed.  According to the birth parent she remembers how to hand express and has no questions or concerns.  [facility] services brochure was reviewed.  [place] left her name on the board and encouraged the birth parent to call if she needs breastfeeding assistance.   Current Feeding Plan:  Breastfeed 8+ times in 24 hours according to feeding cues.  Put the infant to the breast prior to supplementing.  Supplement according to supplementation guidelines.  Call [place] for assistance with breastfeeding.   Maternal Data Has patient been taught Hand Expression?: Yes Does the patient have breastfeeding experience prior to this delivery?: Yes How long did the patient breastfeed?: Birth parent has breast fed 3 other children  Feeding Mother's Current Feeding Choice: Breast Milk and Formula  LATCH Score                    Lactation Tools Discussed/Used    Interventions Interventions: Education;LC Services brochure  Discharge Pump: Manual;Personal  Consult Status Consult Status: Follow-up Date: 10/24/21 Follow-up type:  In-patient    Carolyn Gomez Carolyn Gomez 10/23/2021, 8:14 AM

## 2021-10-23 NOTE — Anesthesia Postprocedure Evaluation (Signed)
Anesthesia Post Note  Patient: Carolyn Gomez  Procedure(s) Performed: AN AD [place]     Patient location during evaluation: Mother Baby Anesthesia Type: Epidural Level of consciousness: awake and alert Pain management: pain level controlled Vital Signs Assessment: post-procedure vital signs reviewed and stable Respiratory status: spontaneous breathing, nonlabored ventilation and respiratory function stable Cardiovascular status: stable Postop Assessment: no headache, no backache and epidural receding Anesthetic complications: no   No notable events documented.  Last Vitals:  Vitals:   10/22/21 2036 10/23/21 0525  BP: (!) 117/59 (!) 99/55  Pulse: 85 68  Resp: 18 18  Temp: 37.1 C 37.2 C  SpO2: 99% 99%    Last Pain:  Vitals:   10/23/21 0525  TempSrc: Oral  PainSc: 6    Pain Goal:                   Rayvon Char

## 2021-11-03 ENCOUNTER — Telehealth (HOSPITAL_COMMUNITY): Payer: Self-pay | Admitting: *Deleted

## 2021-11-03 NOTE — Telephone Encounter (Signed)
Return phone call completed with Language Line interpreter, Haynes Hoehn [phone]. Patient reported that she spoke with OB. Per report - received instructions to "take pain medication and then go to the hospital if the pain did not subside." Patient voiced no other questions or concerns regarding her health at this time. EPDS= 6 Patient voiced no questions or concerns regarding infant at this time. Patient reports infant sleeps in a crib on his back. RN reviewed ABCs of safe sleep. Patient verbalized understanding. Erline Levine, RN, 11/03/21, 2011

## 2021-11-03 NOTE — Telephone Encounter (Signed)
Discharge follow-up phone call completed with Language [address], [place] [phone]. Patient reported that she has been having severe headaches and has swelling in her hands and feet. Patient stated that she "only has a slight headache now." RN instructed patient to come to MAU for evaluation. Patient asked, "What happens if I can't come in tonight?" RN instructed patient to call her OB office after-hours number for guidance. Instructed patient that severe headaches may be an emergency sign, so she can call EMS if she does not have transportation. Patient agreed to call her OB office and have this RN return call in 45minutes for follow-up. Erline Levine, RN, 11/03/21, [phone]

## 2022-05-18 ENCOUNTER — Encounter (HOSPITAL_COMMUNITY): Payer: Self-pay

## 2022-05-18 ENCOUNTER — Other Ambulatory Visit: Payer: Self-pay

## 2022-05-18 ENCOUNTER — Emergency Department (HOSPITAL_COMMUNITY): Payer: 59

## 2022-05-18 ENCOUNTER — Emergency Department (HOSPITAL_COMMUNITY)
Admission: EM | Admit: 2022-05-18 | Discharge: 2022-05-18 | Disposition: A | Discharge status: Home / Self Care | Payer: 59 | Attending: Emergency Medicine | Admitting: Emergency Medicine

## 2022-05-18 DIAGNOSIS — R160 Hepatomegaly, not elsewhere classified: Secondary | ICD-10-CM | POA: Insufficient documentation

## 2022-05-18 DIAGNOSIS — K429 Umbilical hernia without obstruction or gangrene: Secondary | ICD-10-CM | POA: Insufficient documentation

## 2022-05-18 DIAGNOSIS — N3090 Cystitis, unspecified without hematuria: Secondary | ICD-10-CM

## 2022-05-18 DIAGNOSIS — R1012 Left upper quadrant pain: Secondary | ICD-10-CM | POA: Diagnosis not present

## 2022-05-18 LAB — COMPREHENSIVE METABOLIC PANEL
ALT: 28 U/L (ref 0–44)
AST: 25 U/L (ref 15–41)
Albumin: 4.1 g/dL (ref 3.5–5.0)
Alkaline Phosphatase: 72 U/L (ref 38–126)
Anion gap: 11 (ref 5–15)
BUN: 8 mg/dL (ref 6–20)
CO2: 25 mmol/L (ref 22–32)
Calcium: 8.9 mg/dL (ref 8.9–10.3)
Chloride: 105 mmol/L (ref 98–111)
Creatinine, Ser: 0.51 mg/dL (ref 0.44–1.00)
GFR, Estimated: >60 mL/min (ref >60)
Glucose, Bld: 89 mg/dL (ref 70–99)
Potassium: 3.7 mmol/L (ref 3.5–5.1)
Sodium: 141 mmol/L (ref 135–145)
Total Bilirubin: 0.7 mg/dL (ref 0.3–1.2)
Total Protein: 7.3 g/dL (ref 6.5–8.1)

## 2022-05-18 LAB — URINALYSIS, ROUTINE W REFLEX MICROSCOPIC
Bilirubin Urine: NEGATIVE
Glucose, UA: NEGATIVE mg/dL
Ketones, ur: NEGATIVE mg/dL
Nitrite: NEGATIVE
Protein, ur: NEGATIVE mg/dL
Specific Gravity, Urine: 1.005 (ref 1.005–1.030)
pH: 7 (ref 5.0–8.0)

## 2022-05-18 LAB — CBC
HCT: 37.1 % (ref 36.0–46.0)
Hemoglobin: 12.1 g/dL (ref 12.0–15.0)
MCH: 27.4 pg (ref 26.0–34.0)
MCHC: 32.6 g/dL (ref 30.0–36.0)
MCV: 84.1 fL (ref 80.0–100.0)
Platelets: 412 10*3/uL — ABNORMAL HIGH (ref 150–400)
RBC: 4.41 MIL/uL (ref 3.87–5.11)
RDW: 13.6 % (ref 11.5–15.5)
WBC: 8.5 10*3/uL (ref 4.0–10.5)
nRBC: 0 % (ref 0.0–0.2)

## 2022-05-18 LAB — I-STAT BETA HCG BLOOD, ED (MC, WL, AP ONLY): I-stat hCG, quantitative: <5 m[IU]/mL (ref <5)

## 2022-05-18 LAB — LIPASE, BLOOD: Lipase: 31 U/L (ref 11–51)

## 2022-05-18 MED ORDER — KETOROLAC TROMETHAMINE 15 MG/ML IJ SOLN
15.0000 mg | Freq: Once | INTRAMUSCULAR | Status: AC
  Administered 2022-05-18: 15 mg via INTRAVENOUS
  Filled 2022-05-18: qty 1

## 2022-05-18 MED ORDER — IOHEXOL 350 MG/ML SOLN
75.0000 mL | Freq: Once | INTRAVENOUS | Status: AC | PRN
  Administered 2022-05-18: 75 mL via INTRAVENOUS

## 2022-05-18 MED ORDER — CEPHALEXIN 500 MG PO CAPS: 500.0000 mg | ORAL_CAPSULE | Freq: Three times a day (TID) | ORAL | 0 refills | Status: AC

## 2022-05-18 NOTE — Discharge Instructions (Addendum)
Ha sido atendido hoy por su queja de dolor abdominal. Su anlisis de laboratorio mostr una posible infeccin del tracto urinario. Sus imgenes mostraron una hernia umbilical que contena grasa y un hgado agrandado. Sus medicamentos de alta incluyen Keflex. Este es un antibitico. Debes tomarlo segn lo prescrito. Debe tomarlo durante toda la prescripcin. Esto puede causar [employer]. Esto es normal. Puede tomar esto con comida. Tambin puedes comer yogur para prevenir la diarrea. Seguimiento con: Dr. Corliss Skains. Es cirujano general. Llame maana para programar una cita para una visita de seguimiento con respecto a su hernia. Busque atencin mdica inmediata si desarrolla alguno de los siguientes sntomas:  Tiene fiebre o escalofros.  Siente un dolor en la zona baja del vientre que empeora.  Tiene ganas de Wellsite geologist.  No puede empujar la hernia Fish farm manager al presionar suavemente sobre esta cuando est acostado.  La hernia: o Cambia de forma o de tamao. o Cambia de color. o Se siente dura o le duele cuando la toca. En este momento no parece haber una condicin [employer], sin embargo, siempre existe la posibilidad de que las condiciones [place]. Lea y siga las instrucciones a continuacin.  No tome su medicamento si desarrolla sarpullido con picazn, hinchazn en la boca o los labios o dificultad para respirar; Llame al 911 y busque atencin mdica de emergencia inmediata si esto ocurre.  Puede revisar sus pruebas de laboratorio y [place] de imgenes en su totalidad en su cuenta MyChart. Analice todos los resultados de forma completa con su proveedor de atencin primaria y otro especialista en su visita de seguimiento.  Nota: Es posible que partes de este texto se hayan transcrito utilizando un software de Network engineer de voz. Se hizo todo lo posible para garantizar la precisin; sin embargo, es posible que an Nutritional therapist errores de transcripcin computarizados  involuntarios.  You have been seen today for your complaint of abdominal pain. Your lab work showed a possible urinary tract infection. Your imaging showed a fat-containing umbilical hernia and an enlarged liver. Your discharge medications include Keflex. This is an antibiotic. You should take it as prescribed. You should take it for the entire duration of the prescription. This may cause an upset stomach. This is normal. You may take this with food. You may also eat yogurt to prevent diarrhea. Follow up with: Dr. Corliss Skains.  He is a Development worker, international aid.  Call tomorrow to schedule an appointment for follow-up visit regarding your hernia Please seek immediate medical care if you develop any of the following symptoms: Tiene fiebre o escalofros. Siente un dolor en la zona baja del vientre que empeora. Tiene ganas de vomitar o vomita. No puede empujar la hernia Fish farm manager al presionar suavemente sobre esta cuando est acostado. La hernia: Cambia de forma o de tamao. Cambia de color. Se siente dura o le duele cuando la toca. At this time there does not appear to be the presence of an emergent medical condition, however there is always the potential for conditions to change. Please read and follow the below instructions.  Do not take your medicine if  develop an itchy rash, swelling in your mouth or lips, or difficulty breathing; call 911 and seek immediate emergency medical attention if this occurs.  You may review your lab tests and imaging results in their entirety on your MyChart account.  Please discuss all results of fully with your primary care provider and other specialist at your follow-up visit.  Note: Portions of this text may have been  transcribed using voice recognition software. Every effort was made to ensure accuracy; however, inadvertent computerized transcription errors may still be present.

## 2022-05-18 NOTE — ED Provider Triage Note (Signed)
Emergency Medicine Provider Triage Evaluation Note  Carolyn Gomez , a 32 y.o. female  was evaluated in triage.  Pt complains of left upper quadrant pain has been ongoing for several days, described as a sharp sensation which originated on her umbilicus.  Has been taking Tylenol for pain control without much improvement.  Does have exacerbation in symptoms when she takes a deep breath.  No nausea, no vomiting, no fevers.  Last menstrual cycle April 17, 2022.  Review of Systems  Positive: Abdominal pain Negative: Nausea, vomiting  Physical Exam  BP 135/88 (BP Location: Right Arm)   Pulse 69   Temp 98.4 F (36.9 C) (Oral)   Resp 17   Ht 5\' 2"  (1.575 m)   Wt 88.9 kg   SpO2 100%   BMI 35.85 kg/m  Gen:   Awake, no distress   Resp:  Normal effort  MSK:   Moves extremities without difficulty  Other:   LUQ pain with palpation  Medical Decision Making  Medically screening exam initiated at 1:40 PM.  Appropriate orders placed.  [place] Meika Macaraeg was informed that the remainder of the evaluation will be completed by another provider, this initial triage assessment does not replace that evaluation, and the importance of remaining in the ED until their evaluation is complete.     Claude Manges, PA-C 05/18/22 1344

## 2022-05-18 NOTE — ED Provider Notes (Signed)
[place] EMERGENCY DEPARTMENT AT [facility] Provider Note   CSN: [id number] Arrival date & time: 05/18/22  1303     History  Chief Complaint  Patient presents with   Abdominal Pain    Carolyn Gomez Carolyn Gomez is a 32 y.o. female.  With no significant past medical history who presents to the ED for evaluation of left upper quadrant and suprapubic abdominal pain.  Symptoms began approximately 3 days ago.  Abdomen constant during that time.  It is described as an ache.  She denies aggravating or alleviating factors.  Rates the pain at a 4 out of 10 at this time.  Denies chest pain, shortness of breath, nausea, vomiting, diarrhea, constipation, fevers, chills, pelvic pain, dysuria, frequency, urgency, vaginal symptoms.  States she felt like she had a hernia while she was pregnant approximately 6 months ago and feels like it is similar to that.  History was obtained by the patient with use of a Spanish interpreter.   Abdominal Pain      Home Medications Prior to Admission medications   Medication Sig Start Date End Date Taking? Authorizing Provider  cephALEXin (KEFLEX) 500 MG capsule Take 1 capsule (500 mg total) by mouth 3 (three) times daily for 5 days. 05/18/22 05/23/22 Yes Zara Wendt, Edsel Petrin, PA-C  acetaminophen (TYLENOL) 325 MG tablet Take 2 tablets (650 mg total) by mouth every 4 (four) hours as needed for mild pain. 10/23/21   Mercado-Ortiz, Lahoma Crocker, DO  benzocaine-Menthol (DERMOPLAST) 20-0.5 % AERO Apply 1 Application topically as needed for irritation (perineal discomfort). 10/23/21   Mercado-Ortiz, Lahoma Crocker, DO  ferrous sulfate 325 (65 FE) MG tablet Take 1 tablet (325 mg total) by mouth every other day. 10/23/21   Mercado-Ortiz, Lahoma Crocker, DO  ibuprofen (ADVIL) 600 MG tablet Take 1 tablet (600 mg total) by mouth every 6 (six) hours. 10/23/21   Mercado-Ortiz, Lahoma Crocker, DO  senna-docusate (SENOKOT-S) 8.6-50 MG tablet Take 2 tablets by mouth daily. 10/23/21    Mercado-Ortiz, Lahoma Crocker, DO  witch hazel-glycerin (TUCKS) pad Apply 1 Application topically as needed for hemorrhoids. 10/23/21   Mercado-Ortiz, Lahoma Crocker, DO      Allergies    Patient has no known allergies.    Review of Systems   Review of Systems  Gastrointestinal:  Positive for abdominal pain.  All other systems reviewed and are negative.   Physical Exam Updated Vital Signs BP 135/88 (BP Location: Right Arm)   Pulse 69   Temp 98.4 F (36.9 C) (Oral)   Resp 17   Ht 5\' 2"  (1.575 m)   Wt 88.9 kg   SpO2 100%   BMI 35.85 kg/m  Physical Exam Vitals and nursing note reviewed.  Constitutional:      General: She is not in acute distress.    Appearance: She is well-developed. She is obese. She is not ill-appearing, toxic-appearing or diaphoretic.     Comments: Resting comfortably in bed  HENT:     Head: Normocephalic and atraumatic.  Eyes:     Conjunctiva/sclera: Conjunctivae normal.  Cardiovascular:     Rate and Rhythm: Normal rate and regular rhythm.     Heart sounds: No murmur heard. Pulmonary:     Effort: Pulmonary effort is normal. No respiratory distress.     Breath sounds: Normal breath sounds. No wheezing, rhonchi or rales.  Abdominal:     General: Abdomen is protuberant.     Palpations: Abdomen is soft.     Tenderness: There is abdominal tenderness  in the periumbilical area, suprapubic area and left upper quadrant. There is no right CVA tenderness, left CVA tenderness or guarding. Negative signs include Murphy's sign, Rovsing's sign and McBurney's sign.     Hernia: A hernia is present. Hernia is present in the umbilical area (soft, easily reducible but returns immediately).  Musculoskeletal:        General: No swelling.     Cervical back: Neck supple.  Skin:    General: Skin is warm and dry.     Capillary Refill: Capillary refill takes less than 2 seconds.  Neurological:     General: No focal deficit present.     Mental Status: She is alert and oriented to  person, place, and time.  Psychiatric:        Mood and Affect: Mood normal.        Behavior: Behavior normal.     ED Results / Procedures / Treatments   Labs (all labs ordered are listed, but only abnormal results are displayed) Labs Reviewed  CBC - Abnormal; Notable for the following components:      Result Value   Platelets 412 (*)    All other components within normal limits  URINALYSIS, ROUTINE W REFLEX MICROSCOPIC - Abnormal; Notable for the following components:   Color, Urine STRAW (*)    Hgb urine dipstick SMALL (*)    Leukocytes,Ua TRACE (*)    Bacteria, UA RARE (*)    All other components within normal limits  LIPASE, BLOOD  COMPREHENSIVE METABOLIC PANEL  I-STAT BETA HCG BLOOD, ED ([facility], [facility], [facility] ONLY)    EKG None  Radiology CT ABDOMEN PELVIS W CONTRAST  Result Date: 05/18/2022 CLINICAL DATA:  Left lower quadrant abdominal pain. EXAM: CT ABDOMEN AND PELVIS WITH CONTRAST TECHNIQUE: Multidetector CT imaging of the abdomen and pelvis was performed using the standard protocol following bolus administration of intravenous contrast. RADIATION DOSE REDUCTION: This exam was performed according to the departmental dose-optimization program which includes automated exposure control, adjustment of the mA and/or kV according to patient size and/or use of iterative reconstruction technique. CONTRAST:  75mL OMNIPAQUE IOHEXOL 350 MG/ML SOLN COMPARISON:  None Available. FINDINGS: Lower chest: Clear lung bases. Hepatobiliary: Diffusely decreased attenuation of the liver suggestive of steatosis. Enlarged liver measuring 22 cm in craniocaudal dimension. No focal liver abnormality. Unremarkable gallbladder. No biliary dilatation. Pancreas: Unremarkable. Spleen: Unremarkable. Adrenals/Urinary Tract: Unremarkable adrenal glands. No evidence of a renal mass, calculi, or hydronephrosis. Unremarkable bladder. Stomach/Bowel: The stomach is unremarkable. There is no evidence of bowel obstruction or  inflammation. The appendix is unremarkable. Vascular/Lymphatic: Normal caliber of the abdominal aorta. No enlarged lymph nodes. Reproductive: Uterus and bilateral adnexa are unremarkable. Other: No ascites or pneumoperitoneum. Small to moderate-sized fat-containing umbilical hernia with mild fat stranding. Musculoskeletal: No acute osseous abnormality or suspicious osseous lesion. IMPRESSION: 1. Fat-containing umbilical hernia with mild nonspecific stranding in the herniated fat. 2. Hepatomegaly and hepatic steatosis. Electronically Signed   By: Sebastian Ache M.D.   On: 05/18/2022 16:41    Procedures Procedures    Medications Ordered in ED Medications  ketorolac (TORADOL) 15 MG/ML injection 15 mg (15 mg Intravenous Given 05/18/22 1639)  iohexol (OMNIPAQUE) 350 MG/ML injection 75 mL (75 mLs Intravenous Contrast Given 05/18/22 1628)    ED Course/ Medical Decision Making/ A&P Clinical Course as of 05/18/22 1730  Wed May 18, 2022  1621 I was called to bedside by patient's nurse stating that patient was upset that we were not doing anything for her.  Patient reportedly called her PCP who called charge nurse of the ED and was upset stating that the ED was not doing anything for the patient and stated it was because the patient speaks Spanish.  I reassured the patient that we were performing an appropriate workup including physical exam, history, lab work and imaging.  I used a Environmental education officer. [AS]    Clinical Course User Index [AS] Alyshia Kernan, Edsel Petrin, PA-C                             Medical Decision Making Amount and/or Complexity of Data Reviewed Labs: ordered. Radiology: ordered.  This patient presents to the ED for concern of abdominal pain, this involves an extensive number of treatment options, and is a complaint that carries with it a high risk of complications and morbidity. The differential diagnosis for generalized abdominal pain includes, but is not limited to  AAA, gastroenteritis, appendicitis, Bowel obstruction, Bowel perforation. Gastroparesis, DKA, Hernia, Inflammatory bowel disease, mesenteric ischemia, pancreatitis, peritonitis SBP, volvulus.   My initial workup includes abdominal pain labs, CT abdomen pelvis  Additional history obtained from: Nursing notes from this visit.  I ordered, reviewed and interpreted labs which include: CBC, CMP, lipase, urinalysis, hCG.  Urinalysis with small hemoglobin, trace leukocytes, rare bacteria  I ordered imaging studies including CT abdomen pelvis I independently visualized and interpreted imaging which showed umbilical hernia containing fat I agree with the radiologist interpretation  Afebrile, hemodynamically stable.  32 year old female presenting to the ED for evaluation of periumbilical and left upper quadrant abdominal pain.  She is concerned about hernia and states that she developed a hernia when she was pregnant.  She gave birth approximately 6 months ago.  She is in no acute distress.  She appears very well on exam.  There is a very small umbilical hernia which is soft and easily reducible but returns immediately afterwards.  This is consistent with CT scanning which shows umbilical hernia containing only fat.  Low suspicion for incarcerated or strangulated hernia at this time.  Patient also has a urinalysis consistent with UTI with small hemoglobin, leukocytes and bacteria.  She does not have any other urinary symptoms outside of suprapubic tenderness.  Will send prescription for Keflex as this may be contributing to her pain.  She was given contact information for general surgery and encouraged to follow-up.  She was given return precautions.  Stable at discharge.  Of note, patient became upset in the ED after my initial evaluation stating that the emergency department was not doing anything for her.  This was after a full set of abdominal pain labs and CT of the abdomen and pelvis was ordered.  I  performed a thorough history and physical examination as well.  She called her PCP who reportedly called the emergency department and was concerned that she was being treated poorly because she is a [place] speaker.  I used a Aeronautical engineer time I spoke to the patient and explained everything thoroughly.  I treated her pain in the ED as well.  At this time there does not appear to be any evidence of an acute emergency medical condition and the patient appears stable for discharge with appropriate outpatient follow up. Diagnosis was discussed with patient who verbalizes understanding of care plan and is agreeable to discharge. I have discussed return precautions with patient who verbalizes understanding. Patient encouraged to follow-up with their PCP within 1  week. All questions answered.  Note: Portions of this report may have been transcribed using voice recognition software. Every effort was made to ensure accuracy; however, inadvertent computerized transcription errors may still be present.        Final Clinical Impression(s) / ED Diagnoses Final diagnoses:  Umbilical hernia without obstruction and without gangrene  Hepatomegaly    Rx / DC Orders ED Discharge Orders          Ordered    cephALEXin (KEFLEX) 500 MG capsule  3 times daily        05/18/22 1730              Mora Bellman 05/18/22 1730    Benjiman Core, MD 05/18/22 2244

## 2022-05-18 NOTE — ED Triage Notes (Signed)
Pt arrives POV with complaints of LUQ pain x 2 days. Pt denies radiation to back or chest. Pt says that when she was pregnant she thought that she had a hernia because she had a pain similar to this. Denies n/v/d. AOX4

## 2022-06-13 ENCOUNTER — Ambulatory Visit: Payer: Self-pay | Admitting: General Surgery

## 2022-06-13 NOTE — H&P (Signed)
Chief Complaint: New Consultation (Umbilical hernia )     History of Present Illness: [place] Carlette Dark is a 32 y.o. female who is seen today as an office consultation at the request of Dr. Room for evaluation of New Consultation (Umbilical hernia ) .   Patient is a 32 year old Spanish-speaking female who comes in secondary to an umbilical hernia.  Patient was recently seen in the ER secondary to abdominal pain.  States this was periumbilical.  Patient underwent CT scan which revealed a small 1.5 cm fat-containing umbilical hernia.  Patient states that she has continued ongoing pain on and off.  States that it is spread bilaterally.  She had a previous abdominal surgery.  She feels that the hernia was a result of her previous pregnancies.      Review of Systems: A complete review of systems was obtained from the patient.  I have reviewed this information and discussed as appropriate with the patient.  See HPI as well for other ROS.  Review of Systems  Constitutional:  Negative for fever.  HENT:  Negative for congestion.   Eyes:  Negative for blurred vision.  Respiratory:  Negative for cough, shortness of breath and wheezing.   Cardiovascular:  Negative for chest pain and palpitations.  Gastrointestinal:  Negative for heartburn.  Genitourinary:  Negative for dysuria.  Musculoskeletal:  Negative for myalgias.  Skin:  Negative for rash.  Neurological:  Negative for dizziness and headaches.  Psychiatric/Behavioral:  Negative for depression and suicidal ideas.   All other systems reviewed and are negative.     Medical History: History reviewed. No pertinent past medical history.  There is no problem list on file for this patient.   History reviewed. No pertinent surgical history.   No Known Allergies  No current outpatient medications on file prior to visit.  No current facility-administered medications on file prior to visit.   History reviewed. No pertinent family  history.   Social History  Tobacco Use Smoking Status Never Smokeless Tobacco Never    Social History  Socioeconomic History  Marital status: Life Partner Tobacco Use  Smoking status: Never  Smokeless tobacco: Never Vaping Use  Vaping status: Never Used Substance and Sexual Activity  Alcohol use: Never  Drug use: Never  Social Determinants of Health  Food Insecurity: No Food Insecurity (10/22/2021)  Received from [facility]  Hunger Vital Sign   Worried About Running Out of Food in the Last Year: Never true   Ran Out of Food in the Last Year: Never true Transportation Needs: No Transportation Needs (10/22/2021)  Received from [facility] - [facility]   Lack of Transportation (Medical): No   Lack of Transportation (Non-Medical): No   Objective:   Vitals:  06/13/22 0946 BP: 121/82 Pulse: 77 Temp: 36.8 C (98.3 F) SpO2: 99% Weight: 89.3 kg (196 lb 12.8 oz) Height: 157.5 cm (5\' 2" ) PainSc:   3   Body mass index is 36 kg/m.  Physical Exam Constitutional:      Appearance: Normal appearance.  HENT:     Head: Normocephalic and atraumatic.     Mouth/Throat:     Mouth: Mucous membranes are moist.     Pharynx: Oropharynx is clear.  Eyes:     General: No scleral icterus.    Pupils: Pupils are equal, round, and reactive to light.  Cardiovascular:     Rate and Rhythm: Normal rate and regular rhythm.     Pulses: Normal pulses.     Heart sounds:  No murmur heard.    No friction rub. No gallop.  Pulmonary:     Effort: Pulmonary effort is normal. No respiratory distress.     Breath sounds: Normal breath sounds. No stridor.  Abdominal:     General: Abdomen is flat.     Hernia: A hernia is present. Hernia is present in the umbilical area.  Musculoskeletal:        General: No swelling.  Skin:    General: Skin is warm.  Neurological:     General: No focal deficit present.     Mental Status: She is alert and oriented to person, place, and time.  Mental status is at baseline.  Psychiatric:        Mood and Affect: Mood normal.        Thought Content: Thought content normal.        Judgment: Judgment normal.      Hernia Size: 1.5 cm Incarcerated: No Initial Hernia   Assessment and Plan: Diagnoses and all orders for this visit:  Umbilical hernia without obstruction or gangrene    [place] Liyana Suniga is a 32 y.o. female   1.  We will proceed to the OR for a lap ventral hernia repair with mesh. 2. All risks and benefits were discussed with the patient, to generally include infection, bleeding, damage to surrounding structures, acute and chronic nerve pain, and recurrence. Alternatives were offered and described.  All questions were answered and the patient voiced understanding of the procedure and wishes to proceed at this point.       No follow-ups on file.  Axel Filler, MD, [facility], [place] [facility]

## 2022-07-18 ENCOUNTER — Encounter (HOSPITAL_COMMUNITY): Payer: Self-pay | Admitting: General Surgery

## 2022-07-18 NOTE — Progress Notes (Signed)
I spoke with Mrs Carolyn Gomez using a [address] [place] , [place] # [address]440.  Mrs. Carolyn Gomez denies chest pain or shortness of breath.  Patient denies having any s/s of Covid in her household, also denies any known exposure to Covid. Mrs Carolyn Gomez denies  any s/s of upper or lower respiratory in the past 8 weeks. Patient's PCP is Bertram Denver, NP.

## 2022-07-19 ENCOUNTER — Encounter (HOSPITAL_COMMUNITY)
Admission: RE | Disposition: A | Discharge status: Home / Self Care | Payer: Self-pay | Source: Home / Self Care | Attending: General Surgery

## 2022-07-19 ENCOUNTER — Other Ambulatory Visit: Payer: Self-pay

## 2022-07-19 ENCOUNTER — Ambulatory Visit (HOSPITAL_BASED_OUTPATIENT_CLINIC_OR_DEPARTMENT_OTHER): Payer: 59

## 2022-07-19 ENCOUNTER — Ambulatory Visit (HOSPITAL_COMMUNITY)
Admission: RE | Admit: 2022-07-19 | Discharge: 2022-07-19 | Disposition: A | Discharge status: Home / Self Care | Payer: 59 | Attending: General Surgery | Admitting: General Surgery

## 2022-07-19 ENCOUNTER — Ambulatory Visit (HOSPITAL_COMMUNITY): Payer: 59

## 2022-07-19 ENCOUNTER — Encounter (HOSPITAL_COMMUNITY): Payer: Self-pay | Admitting: General Surgery

## 2022-07-19 DIAGNOSIS — K429 Umbilical hernia without obstruction or gangrene: Secondary | ICD-10-CM | POA: Insufficient documentation

## 2022-07-19 HISTORY — PX: UMBILICAL HERNIA REPAIR: SHX196

## 2022-07-19 HISTORY — PX: INSERTION OF MESH: SHX5868

## 2022-07-19 LAB — CBC
HCT: 34.2 % — ABNORMAL LOW (ref 36.0–46.0)
Hemoglobin: 11.4 g/dL — ABNORMAL LOW (ref 12.0–15.0)
MCH: 28.2 pg (ref 26.0–34.0)
MCHC: 33.3 g/dL (ref 30.0–36.0)
MCV: 84.7 fL (ref 80.0–100.0)
Platelets: 374 10*3/uL (ref 150–400)
RBC: 4.04 MIL/uL (ref 3.87–5.11)
RDW: 13.4 % (ref 11.5–15.5)
WBC: 8.6 10*3/uL (ref 4.0–10.5)
nRBC: 0 % (ref 0.0–0.2)

## 2022-07-19 LAB — POCT PREGNANCY, URINE: Preg Test, Ur: NEGATIVE

## 2022-07-19 SURGERY — REPAIR, HERNIA, UMBILICAL, LAPAROSCOPIC
Anesthesia: General | Site: Abdomen

## 2022-07-19 MED ORDER — AMISULPRIDE (ANTIEMETIC) 5 MG/2ML IV SOLN: 10.0000 mg | Freq: Once | INTRAVENOUS | Status: DC | PRN

## 2022-07-19 MED ORDER — ORAL CARE MOUTH RINSE: 15.0000 mL | Freq: Once | OROMUCOSAL | Status: AC

## 2022-07-19 MED ORDER — OXYCODONE HCL 5 MG PO TABS
ORAL_TABLET | ORAL | Status: AC
  Filled 2022-07-19: qty 1

## 2022-07-19 MED ORDER — PROPOFOL 10 MG/ML IV BOLUS
INTRAVENOUS | Status: AC
  Filled 2022-07-19: qty 20

## 2022-07-19 MED ORDER — CHLORHEXIDINE GLUCONATE 0.12 % MT SOLN
15.0000 mL | Freq: Once | OROMUCOSAL | Status: AC
  Administered 2022-07-19: 15 mL via OROMUCOSAL
  Filled 2022-07-19: qty 15

## 2022-07-19 MED ORDER — TRAMADOL HCL 50 MG PO TABS: 50.0000 mg | ORAL_TABLET | Freq: Four times a day (QID) | ORAL | 0 refills | Status: AC | PRN

## 2022-07-19 MED ORDER — MIDAZOLAM HCL 2 MG/2ML IJ SOLN
INTRAMUSCULAR | Status: AC
  Filled 2022-07-19: qty 2

## 2022-07-19 MED ORDER — HYDROMORPHONE HCL 1 MG/ML IJ SOLN
0.2500 mg | INTRAMUSCULAR | Status: DC | PRN
  Administered 2022-07-19 (×2): 0.5 mg via INTRAVENOUS

## 2022-07-19 MED ORDER — CHLORHEXIDINE GLUCONATE CLOTH 2 % EX PADS: 6.0000 | MEDICATED_PAD | Freq: Once | CUTANEOUS | Status: DC

## 2022-07-19 MED ORDER — FENTANYL CITRATE (PF) 250 MCG/5ML IJ SOLN
INTRAMUSCULAR | Status: DC | PRN
  Administered 2022-07-19 (×3): 50 ug via INTRAVENOUS

## 2022-07-19 MED ORDER — HYDROMORPHONE HCL 1 MG/ML IJ SOLN
INTRAMUSCULAR | Status: AC
  Filled 2022-07-19: qty 1

## 2022-07-19 MED ORDER — FENTANYL CITRATE (PF) 250 MCG/5ML IJ SOLN
INTRAMUSCULAR | Status: AC
  Filled 2022-07-19: qty 5

## 2022-07-19 MED ORDER — SUGAMMADEX SODIUM 200 MG/2ML IV SOLN
INTRAVENOUS | Status: DC | PRN
  Administered 2022-07-19: 200 mg via INTRAVENOUS

## 2022-07-19 MED ORDER — OXYCODONE HCL 5 MG PO TABS
5.0000 mg | ORAL_TABLET | Freq: Once | ORAL | Status: AC | PRN
  Administered 2022-07-19: 5 mg via ORAL

## 2022-07-19 MED ORDER — BUPIVACAINE HCL 0.25 % IJ SOLN
INTRAMUSCULAR | Status: DC | PRN
  Administered 2022-07-19: 6 mL

## 2022-07-19 MED ORDER — DEXAMETHASONE SODIUM PHOSPHATE 10 MG/ML IJ SOLN
INTRAMUSCULAR | Status: DC | PRN
  Administered 2022-07-19: 10 mg via INTRAVENOUS

## 2022-07-19 MED ORDER — KETOROLAC TROMETHAMINE 30 MG/ML IJ SOLN
INTRAMUSCULAR | Status: AC
  Filled 2022-07-19: qty 1

## 2022-07-19 MED ORDER — ROCURONIUM BROMIDE 10 MG/ML (PF) SYRINGE
PREFILLED_SYRINGE | INTRAVENOUS | Status: AC
  Filled 2022-07-19: qty 10

## 2022-07-19 MED ORDER — MEPERIDINE HCL 25 MG/ML IJ SOLN: 6.2500 mg | INTRAMUSCULAR | Status: DC | PRN

## 2022-07-19 MED ORDER — OXYCODONE HCL 5 MG/5ML PO SOLN: 5.0000 mg | Freq: Once | ORAL | Status: AC | PRN

## 2022-07-19 MED ORDER — BUPIVACAINE HCL (PF) 0.25 % IJ SOLN
INTRAMUSCULAR | Status: AC
  Filled 2022-07-19: qty 30

## 2022-07-19 MED ORDER — PROMETHAZINE HCL 25 MG/ML IJ SOLN: 6.2500 mg | INTRAMUSCULAR | Status: DC | PRN

## 2022-07-19 MED ORDER — CEFAZOLIN SODIUM-DEXTROSE 2-4 GM/100ML-% IV SOLN
2.0000 g | INTRAVENOUS | Status: AC
  Administered 2022-07-19: 2 g via INTRAVENOUS
  Filled 2022-07-19: qty 100

## 2022-07-19 MED ORDER — DEXAMETHASONE SODIUM PHOSPHATE 10 MG/ML IJ SOLN
INTRAMUSCULAR | Status: AC
  Filled 2022-07-19: qty 1

## 2022-07-19 MED ORDER — KETOROLAC TROMETHAMINE 30 MG/ML IJ SOLN
30.0000 mg | Freq: Once | INTRAMUSCULAR | Status: AC
  Administered 2022-07-19: 30 mg via INTRAVENOUS

## 2022-07-19 MED ORDER — ROCURONIUM BROMIDE 10 MG/ML (PF) SYRINGE
PREFILLED_SYRINGE | INTRAVENOUS | Status: DC | PRN
  Administered 2022-07-19: 50 mg via INTRAVENOUS

## 2022-07-19 MED ORDER — ONDANSETRON HCL 4 MG/2ML IJ SOLN
INTRAMUSCULAR | Status: AC
  Filled 2022-07-19: qty 2

## 2022-07-19 MED ORDER — LACTATED RINGERS IV SOLN: INTRAVENOUS | Status: DC

## 2022-07-19 MED ORDER — LIDOCAINE 2% (20 MG/ML) 5 ML SYRINGE
INTRAMUSCULAR | Status: AC
  Filled 2022-07-19: qty 5

## 2022-07-19 MED ORDER — ONDANSETRON HCL 4 MG/2ML IJ SOLN
INTRAMUSCULAR | Status: DC | PRN
Start: 2022-07-19 — End: 2022-07-19
  Administered 2022-07-19: 4 mg via INTRAVENOUS

## 2022-07-19 MED ORDER — ACETAMINOPHEN 10 MG/ML IV SOLN
INTRAVENOUS | Status: AC
  Filled 2022-07-19: qty 100

## 2022-07-19 MED ORDER — 0.9 % SODIUM CHLORIDE (POUR BTL) OPTIME
TOPICAL | Status: DC | PRN
  Administered 2022-07-19: 1000 mL

## 2022-07-19 MED ORDER — LIDOCAINE 2% (20 MG/ML) 5 ML SYRINGE
INTRAMUSCULAR | Status: DC | PRN
  Administered 2022-07-19: 90 mg via INTRAVENOUS

## 2022-07-19 MED ORDER — MIDAZOLAM HCL 2 MG/2ML IJ SOLN
INTRAMUSCULAR | Status: DC | PRN
  Administered 2022-07-19: 2 mg via INTRAVENOUS

## 2022-07-19 MED ORDER — GLYCOPYRROLATE PF 0.2 MG/ML IJ SOSY
PREFILLED_SYRINGE | INTRAMUSCULAR | Status: AC
  Filled 2022-07-19: qty 1

## 2022-07-19 MED ORDER — ACETAMINOPHEN 10 MG/ML IV SOLN
1000.0000 mg | Freq: Once | INTRAVENOUS | Status: DC | PRN
  Administered 2022-07-19: 1000 mg via INTRAVENOUS

## 2022-07-19 MED ORDER — PROPOFOL 10 MG/ML IV BOLUS
INTRAVENOUS | Status: DC | PRN
  Administered 2022-07-19: 150 mg via INTRAVENOUS

## 2022-07-19 SURGICAL SUPPLY — 46 items
ADH SKN CLS APL DERMABOND .7 (GAUZE/BANDAGES/DRESSINGS) ×2
APL PRP STRL LF DISP 70% ISPRP (MISCELLANEOUS) ×2
BAG COUNTER SPONGE SURGICOUNT (BAG) ×2 IMPLANT
BAG SPNG CNTER NS LX DISP (BAG) ×2
BLADE CLIPPER SURG (BLADE) IMPLANT
CANISTER SUCT 3000ML PPV (MISCELLANEOUS) IMPLANT
CHLORAPREP W/TINT 26 (MISCELLANEOUS) ×2 IMPLANT
COVER SURGICAL LIGHT HANDLE (MISCELLANEOUS) ×2 IMPLANT
DERMABOND ADVANCED .7 DNX12 (GAUZE/BANDAGES/DRESSINGS) ×2 IMPLANT
DEVICE SECURE STRAP 25 ABSORB (INSTRUMENTS) ×2 IMPLANT
DEVICE TROCAR PUNCTURE CLOSURE (ENDOMECHANICALS) ×2 IMPLANT
ELECT REM PT RETURN 9FT ADLT (ELECTROSURGICAL) ×2
ELECTRODE REM PT RTRN 9FT ADLT (ELECTROSURGICAL) ×2 IMPLANT
GLOVE BIO SURGEON STRL SZ7.5 (GLOVE) ×4 IMPLANT
GOWN STRL REUS W/ TWL LRG LVL3 (GOWN DISPOSABLE) ×4 IMPLANT
GOWN STRL REUS W/ TWL XL LVL3 (GOWN DISPOSABLE) ×2 IMPLANT
GOWN STRL REUS W/TWL LRG LVL3 (GOWN DISPOSABLE) ×4
GOWN STRL REUS W/TWL XL LVL3 (GOWN DISPOSABLE) ×2
IRRIG SUCT STRYKERFLOW 2 WTIP (MISCELLANEOUS)
IRRIGATION SUCT STRKRFLW 2 WTP (MISCELLANEOUS) IMPLANT
KIT BASIN OR (CUSTOM PROCEDURE TRAY) ×2 IMPLANT
KIT TURNOVER KIT B (KITS) ×2 IMPLANT
MARKER SKIN DUAL TIP RULER LAB (MISCELLANEOUS) ×2 IMPLANT
MESH VENTRALIGHT ST 4.5IN (Mesh General) IMPLANT
NDL INSUFFLATION 14GA 120MM (NEEDLE) ×2 IMPLANT
NDL SPNL 22GX3.5 QUINCKE BK (NEEDLE) IMPLANT
NEEDLE INSUFFLATION 14GA 120MM (NEEDLE) ×2 IMPLANT
NEEDLE SPNL 22GX3.5 QUINCKE BK (NEEDLE) IMPLANT
NS IRRIG 1000ML POUR BTL (IV SOLUTION) ×2 IMPLANT
PAD ARMBOARD 7.5X6 YLW CONV (MISCELLANEOUS) ×4 IMPLANT
SCISSORS LAP 5X35 DISP (ENDOMECHANICALS) ×2 IMPLANT
SET TUBE SMOKE EVAC HIGH FLOW (TUBING) ×2 IMPLANT
SLEEVE Z-THREAD 5X100MM (TROCAR) ×2 IMPLANT
SUT CHROMIC 2 0 SH (SUTURE) ×2 IMPLANT
SUT ETHIBOND 0 MO6 C/R (SUTURE) IMPLANT
SUT MNCRL AB 4-0 PS2 18 (SUTURE) ×2 IMPLANT
SUT NOVA 1 T20/GS 25DT (SUTURE) IMPLANT
SUT PROLENE 2 0 KS (SUTURE) ×4 IMPLANT
TOWEL GREEN STERILE (TOWEL DISPOSABLE) ×2 IMPLANT
TOWEL GREEN STERILE FF (TOWEL DISPOSABLE) ×2 IMPLANT
TRAY LAPAROSCOPIC MC (CUSTOM PROCEDURE TRAY) ×2 IMPLANT
TROCAR 11X100 Z THREAD (TROCAR) IMPLANT
TROCAR BALLN 12MMX100 BLUNT (TROCAR) IMPLANT
TROCAR Z-THREAD OPTICAL 5X100M (TROCAR) ×2 IMPLANT
WARMER LAPAROSCOPE (MISCELLANEOUS) ×2 IMPLANT
WATER STERILE IRR 1000ML POUR (IV SOLUTION) ×2 IMPLANT

## 2022-07-19 NOTE — Op Note (Signed)
07/19/2022  8:04 AM  PATIENT:  Carolyn Gomez  32 y.o. female  PRE-OPERATIVE DIAGNOSIS: 2cm UMBILICAL HERNIA  POST-OPERATIVE DIAGNOSIS: 2cm UMBILICAL HERNIA  PROCEDURE:  Procedure(s): LAPAROSCOPIC UMBILICAL HERNIA (N/A) INSERTION OF MESH  SURGEON:  Surgeons and Role:    Axel Filler, MD - Primary  ASSISTANTS: Rockwell Germany, RNFA   ANESTHESIA:   local and general  EBL:  minimal   BLOOD ADMINISTERED:none  DRAINS: none   LOCAL MEDICATIONS USED:  BUPIVICAINE   SPECIMEN:  No Specimen  DISPOSITION OF SPECIMEN:  N/A  COUNTS:  YES  TOURNIQUET:  * No tourniquets in log *  DICTATION: .Dragon Dictation Findings: 2cm umbilical hernia  Details of the procedure:  After the patient was consented patient was taken back to the operating room patient was then placed in supine position bilateral SCDs in place.  The patient was prepped and draped in the usual sterile fashion. After antibiotics were confirmed a timeout was called and all facts were verified. The Veress needle technique was used to insuflate the abdomen at Palmer's point. The abdomen was insufflated to 14 mm mercury. Subsequently a 5 mm trocar was placed a camera inserted there was no injury to any intra-abdominal organs.    There was seen to be a non-incarcerated  2cm hernia.  A second camera port was in placed into the left lower quadrant.   At this the Falicform ligament was taken down with Bovie cautery maintaining hemostasis. A 5mm port was placed in the epigastrium .   I proceeded to reduce the hernia contents.  The hernia sac was dissected out of the hernia and disposed.  The fascia at the hernia was reapproximated using a #1 ethibonds x 1.  Once the hernia was cleared away, a Bard Ventralight 11.4cm  mesh was inserted into the abdomen.  The mesh was secured circumferentially with am Securestrap tacker in a double crown fashion.    The omentum was brought over the area of the mesh.  A 0 vicryl and an  Endoclose were used to reapproximate the left lower quadrant trocar site. The pneumoperitoneum was evacuated  & all trocars  were removed. The skin was reapproximated with 4-0  Monocryl sutures in a subcuticular fashion. The skin was dressed with Dermabond.  The patient was taken to the recovery room in stable condition.  Type of repair -primary suture & mesh  Mesh overlap - 5cm  Placement of mesh -  beneath fascia and into peritoneal cavity  Size: 3cm, Primary Hernia, and Reducible Hernia    PLAN OF CARE: Discharge to home after PACU  PATIENT DISPOSITION:  PACU - hemodynamically stable.   Delay start of Pharmacological VTE agent (>24hrs) due to surgical blood loss or risk of bleeding: not applicable

## 2022-07-19 NOTE — Anesthesia Procedure Notes (Signed)
Procedure Name: Intubation Date/Time: 07/19/2022 7:43 AM  Performed by: Kayleen Memos, CRNAPre-anesthesia Checklist: Patient identified, Emergency Drugs available, Suction available, Patient being monitored and Timeout performed Patient Re-evaluated:Patient Re-evaluated prior to induction Oxygen Delivery Method: Circle system utilized Preoxygenation: Pre-oxygenation with 100% oxygen Induction Type: IV induction Ventilation: Mask ventilation without difficulty Laryngoscope Size: Mac and 4 Grade View: Grade II Tube type: Oral Tube size: 7.0 mm Airway Equipment and Method: Stylet (anterior pressure applied) Placement Confirmation: ETT inserted through vocal cords under direct vision, positive ETCO2 and breath sounds checked- equal and bilateral Secured at: 22 cm Tube secured with: Tape Dental Injury: Teeth and Oropharynx as per pre-operative assessment

## 2022-07-19 NOTE — Discharge Instructions (Signed)

## 2022-07-19 NOTE — Anesthesia Postprocedure Evaluation (Signed)
Anesthesia Post Note  Patient: [place] Cruz Merlan  Procedure(s) Performed: LAPAROSCOPIC UMBILICAL HERNIA (Abdomen) INSERTION OF MESH     Patient location during evaluation: PACU Anesthesia Type: General Level of consciousness: awake and alert Pain management: pain level controlled Vital Signs Assessment: post-procedure vital signs reviewed and stable Respiratory status: spontaneous breathing, nonlabored ventilation and respiratory function stable Cardiovascular status: blood pressure returned to baseline and stable Postop Assessment: no apparent nausea or vomiting Anesthetic complications: no   No notable events documented.  Last Vitals:  Vitals:   07/19/22 0845 07/19/22 0900  BP: (!) 114/92 120/86  Pulse: 65 62  Resp: 17 18  Temp:  36.5 C  SpO2: 99% 96%    Last Pain:  Vitals:   07/19/22 0845  TempSrc:   PainSc: 4                  Lowella Curb

## 2022-07-19 NOTE — Anesthesia Preprocedure Evaluation (Signed)
Anesthesia Evaluation  Patient identified by MRN, date of birth, ID band Patient awake    Reviewed: Allergy & Precautions, H&P , NPO status , Patient's Chart, lab work & pertinent test results  Airway Mallampati: II  TM Distance: >3 FB Neck ROM: Full    Dental no notable dental hx.    Pulmonary neg pulmonary ROS   Pulmonary exam normal breath sounds clear to auscultation       Cardiovascular negative cardio ROS Normal cardiovascular exam Rhythm:Regular Rate:Normal     Neuro/Psych  Headaches   Depression     negative psych ROS   GI/Hepatic negative GI ROS, Neg liver ROS,,,  Endo/Other  negative endocrine ROS    Renal/GU negative Renal ROS  negative genitourinary   Musculoskeletal negative musculoskeletal ROS (+)    Abdominal  (+) + obese  Peds negative pediatric ROS (+)  Hematology negative hematology ROS (+)   Anesthesia Other Findings   Reproductive/Obstetrics negative OB ROS                             Anesthesia Physical Anesthesia Plan  ASA: 2  Anesthesia Plan: General   Post-op Pain Management: Dilaudid IV   Induction: Intravenous  PONV Risk Score and Plan: 3 and Ondansetron, Dexamethasone, Midazolam and Treatment may vary due to age or medical condition  Airway Management Planned: Oral ETT  Additional Equipment:   Intra-op Plan:   Post-operative Plan: Extubation in OR  Informed Consent: I have reviewed the patients History and Physical, chart, labs and discussed the procedure including the risks, benefits and alternatives for the proposed anesthesia with the patient or authorized representative who has indicated his/her understanding and acceptance.     Dental advisory given  Plan Discussed with: CRNA  Anesthesia Plan Comments:        Anesthesia Quick Evaluation

## 2022-07-19 NOTE — H&P (Signed)
Chief Complaint: New Consultation (Umbilical hernia )       History of Present Illness: Carolyn Gomez Carolyn Gomez is a 32 y.o. female who is seen today as an office consultation at the request of Dr. Room for evaluation of New Consultation (Umbilical hernia ) .   Patient is a 32 year old Spanish-speaking female who comes in secondary to an umbilical hernia.  Patient was recently seen in the ER secondary to abdominal pain.  States this was periumbilical.  Patient underwent CT scan which revealed a small 1.5 cm fat-containing umbilical hernia.   Patient states that she has continued ongoing pain on and off.  States that it is spread bilaterally.   She had a previous abdominal surgery.  She feels that the hernia was a result of her previous pregnancies.           Review of Systems: A complete review of systems was obtained from the patient.  I have reviewed this information and discussed as appropriate with the patient.  See HPI as well for other ROS.   Review of Systems  Constitutional:  Negative for fever.  HENT:  Negative for congestion.   Eyes:  Negative for blurred vision.  Respiratory:  Negative for cough, shortness of breath and wheezing.   Cardiovascular:  Negative for chest pain and palpitations.  Gastrointestinal:  Negative for heartburn.  Genitourinary:  Negative for dysuria.  Musculoskeletal:  Negative for myalgias.  Skin:  Negative for rash.  Neurological:  Negative for dizziness and headaches.  Psychiatric/Behavioral:  Negative for depression and suicidal ideas.   All other systems reviewed and are negative.       Medical History: History reviewed. No pertinent past medical history.   There is no problem list on file for this patient.     History reviewed. No pertinent surgical history.    No Known Allergies   No current outpatient medications on file prior to visit.   No current facility-administered medications on file prior to visit.     History reviewed.  No pertinent family history.    Social History   Tobacco Use Smoking StatusNever Smokeless TobaccoNever     Social History   Socioeconomic History Marital status:Life Partner Tobacco Use Smoking status:Never Smokeless tobacco:Never Vaping Use Vaping status:Never Used Substance and Sexual Activity Alcohol YQM:VHQIO Drug NGE:XBMWU   Social Determinants of Health   Food Insecurity: No Food Insecurity (10/22/2021)             Received [facility]             Hunger Vital Sign                        Worried About Running Out of Food in the Last Year: Never true                        Ran Out of Food in the Last Year: Never true Transportation Needs: No Transportation Needs (10/22/2021)             Received from [facility] - Transportation                        Lack of Transportation (Medical): No                        Lack of  Transportation (Non-Medical): No     Objective:     Vitals:             06/13/22 0946 BP:121/82 Pulse:77 Temp:36.8 C (98.3 F) SpO2:99% Weight:89.3 kg (196 lb 12.8 oz) Height:157.5 cm (5\' 2" ) PainSc:              3   Body mass index is 36 kg/m.   Physical Exam Constitutional:      Appearance: Normal appearance.  HENT:     Head: Normocephalic and atraumatic.     Mouth/Throat:     Mouth: Mucous membranes are moist.     Pharynx: Oropharynx is clear.  Eyes:     General: No scleral icterus.    Pupils: Pupils are equal, round, and reactive to light.  Cardiovascular:     Rate and Rhythm: Normal rate and regular rhythm.     Pulses: Normal pulses.     Heart sounds: No murmur heard.    No friction rub. No gallop.  Pulmonary:     Effort: Pulmonary effort is normal. No respiratory distress.     Breath sounds: Normal breath sounds. No stridor.  Abdominal:     General: Abdomen is flat.     Hernia: A hernia is present. Hernia is present in the umbilical area.  Musculoskeletal:        General: No swelling.   Skin:    General: Skin is warm.  Neurological:     General: No focal deficit present.     Mental Status: She is alert and oriented to person, place, and time. Mental status is at baseline.  Psychiatric:        Mood and Affect: Mood normal.        Thought Content: Thought content normal.        Judgment: Judgment normal.        Hernia Size: 1.5 cm Incarcerated: No Initial Hernia     Assessment and Plan: Diagnoses and all orders for this visit:   Umbilical hernia without obstruction or gangrene     Carolyn Gomez Carolyn Gomez is a 32 y.o. female    1.          We will proceed to the OR for a lap ventral hernia repair with mesh. 2.         All risks and benefits were discussed with the patient, to generally include infection, bleeding, damage to surrounding structures, acute and chronic nerve pain, and recurrence. Alternatives were offered and described.  All questions were answered and the patient voiced understanding of the procedure and wishes to proceed at this point.

## 2022-07-19 NOTE — Transfer of Care (Signed)
Immediate Anesthesia Transfer of Care Note  Patient: Carolyn Gomez  Procedure(s) Performed: LAPAROSCOPIC UMBILICAL HERNIA (Abdomen) INSERTION OF MESH  Patient Location: PACU  Anesthesia Type:General  Level of Consciousness: awake, alert , and oriented  Airway & Oxygen Therapy: Patient Spontanous Breathing  Post-op Assessment: Report given to RN, Post -op Vital signs reviewed and stable, and Patient moving all extremities  Post vital signs: stable  Last Vitals:  Vitals Value Taken Time  BP 111/79 07/19/22 0818  Temp    Pulse 74 07/19/22 0822  Resp 16 07/19/22 0822  SpO2 100 % 07/19/22 0822  Vitals shown include unfiled device data.  Last Pain:  Vitals:   07/19/22 9528  TempSrc:   PainSc: 0-No pain         Complications: No notable events documented.

## 2022-07-20 ENCOUNTER — Encounter (HOSPITAL_COMMUNITY): Payer: Self-pay | Admitting: General Surgery
